# Patient Record
Sex: Male | Born: 1960 | Race: Black or African American | Hispanic: No | Marital: Single | Smoking: Current every day smoker
Health system: Southern US, Community
[De-identification: ages and names within clinical notes are randomized; demographics above are authoritative.]

## PROBLEM LIST (undated history)

## (undated) DIAGNOSIS — I739 Peripheral vascular disease, unspecified: Secondary | ICD-10-CM

## (undated) DIAGNOSIS — J45909 Unspecified asthma, uncomplicated: Secondary | ICD-10-CM

## (undated) DIAGNOSIS — C801 Malignant (primary) neoplasm, unspecified: Secondary | ICD-10-CM

## (undated) HISTORY — DX: Peripheral vascular disease, unspecified: I73.9

---

## 2013-09-18 DIAGNOSIS — C801 Malignant (primary) neoplasm, unspecified: Secondary | ICD-10-CM

## 2013-09-18 HISTORY — DX: Malignant (primary) neoplasm, unspecified: C80.1

## 2013-10-03 ENCOUNTER — Emergency Department (HOSPITAL_COMMUNITY): Payer: Medicaid Other

## 2013-10-03 ENCOUNTER — Encounter (HOSPITAL_COMMUNITY): Payer: Self-pay | Admitting: Emergency Medicine

## 2013-10-03 ENCOUNTER — Inpatient Hospital Stay (HOSPITAL_COMMUNITY)
Admission: EM | Admit: 2013-10-03 | Discharge: 2013-10-08 | DRG: 330 | Disposition: A | Discharge status: Home / Self Care | Payer: Medicaid Other | Attending: Surgery | Admitting: Surgery

## 2013-10-03 DIAGNOSIS — F17200 Nicotine dependence, unspecified, uncomplicated: Secondary | ICD-10-CM | POA: Diagnosis present

## 2013-10-03 DIAGNOSIS — K5669 Other intestinal obstruction: Secondary | ICD-10-CM | POA: Diagnosis present

## 2013-10-03 DIAGNOSIS — I708 Atherosclerosis of other arteries: Secondary | ICD-10-CM | POA: Diagnosis present

## 2013-10-03 DIAGNOSIS — IMO0002 Reserved for concepts with insufficient information to code with codable children: Secondary | ICD-10-CM

## 2013-10-03 DIAGNOSIS — R188 Other ascites: Secondary | ICD-10-CM | POA: Diagnosis present

## 2013-10-03 DIAGNOSIS — D72829 Elevated white blood cell count, unspecified: Secondary | ICD-10-CM | POA: Diagnosis present

## 2013-10-03 DIAGNOSIS — I745 Embolism and thrombosis of iliac artery: Secondary | ICD-10-CM | POA: Diagnosis present

## 2013-10-03 DIAGNOSIS — D62 Acute posthemorrhagic anemia: Secondary | ICD-10-CM | POA: Diagnosis not present

## 2013-10-03 DIAGNOSIS — D490 Neoplasm of unspecified behavior of digestive system: Secondary | ICD-10-CM

## 2013-10-03 DIAGNOSIS — C180 Malignant neoplasm of cecum: Principal | ICD-10-CM | POA: Diagnosis present

## 2013-10-03 DIAGNOSIS — E46 Unspecified protein-calorie malnutrition: Secondary | ICD-10-CM | POA: Diagnosis present

## 2013-10-03 DIAGNOSIS — Z8042 Family history of malignant neoplasm of prostate: Secondary | ICD-10-CM

## 2013-10-03 DIAGNOSIS — D649 Anemia, unspecified: Secondary | ICD-10-CM | POA: Diagnosis present

## 2013-10-03 DIAGNOSIS — K56609 Unspecified intestinal obstruction, unspecified as to partial versus complete obstruction: Secondary | ICD-10-CM

## 2013-10-03 DIAGNOSIS — C772 Secondary and unspecified malignant neoplasm of intra-abdominal lymph nodes: Secondary | ICD-10-CM | POA: Diagnosis present

## 2013-10-03 DIAGNOSIS — C786 Secondary malignant neoplasm of retroperitoneum and peritoneum: Secondary | ICD-10-CM | POA: Diagnosis present

## 2013-10-03 DIAGNOSIS — C189 Malignant neoplasm of colon, unspecified: Secondary | ICD-10-CM | POA: Diagnosis present

## 2013-10-03 DIAGNOSIS — R109 Unspecified abdominal pain: Secondary | ICD-10-CM

## 2013-10-03 DIAGNOSIS — Z801 Family history of malignant neoplasm of trachea, bronchus and lung: Secondary | ICD-10-CM

## 2013-10-03 DIAGNOSIS — I739 Peripheral vascular disease, unspecified: Secondary | ICD-10-CM | POA: Diagnosis present

## 2013-10-03 DIAGNOSIS — R634 Abnormal weight loss: Secondary | ICD-10-CM | POA: Diagnosis present

## 2013-10-03 LAB — COMPREHENSIVE METABOLIC PANEL
ALBUMIN: 3.1 g/dL — AB (ref 3.5–5.2)
ALK PHOS: 71 U/L (ref 39–117)
ALT: 9 U/L (ref 0–53)
AST: 13 U/L (ref 0–37)
BUN: 13 mg/dL (ref 6–23)
CHLORIDE: 94 meq/L — AB (ref 96–112)
CO2: 21 mEq/L (ref 19–32)
Calcium: 9.1 mg/dL (ref 8.4–10.5)
Creatinine, Ser: 0.85 mg/dL (ref 0.50–1.35)
GFR calc Af Amer: >90 mL/min (ref >90)
GFR calc non Af Amer: >90 mL/min (ref >90)
Glucose, Bld: 110 mg/dL — ABNORMAL HIGH (ref 70–99)
POTASSIUM: 3.7 meq/L (ref 3.7–5.3)
SODIUM: 134 meq/L — AB (ref 137–147)
Total Bilirubin: 0.3 mg/dL (ref 0.3–1.2)
Total Protein: 6.8 g/dL (ref 6.0–8.3)

## 2013-10-03 LAB — ABO/RH: ABO/RH(D): O POS

## 2013-10-03 LAB — URINALYSIS, ROUTINE W REFLEX MICROSCOPIC
Glucose, UA: NEGATIVE mg/dL
Hgb urine dipstick: NEGATIVE
KETONES UR: 15 mg/dL — AB
LEUKOCYTES UA: NEGATIVE
Nitrite: NEGATIVE
PH: 6 (ref 5.0–8.0)
Protein, ur: 100 mg/dL — AB
Specific Gravity, Urine: 1.033 — ABNORMAL HIGH (ref 1.005–1.030)
Urobilinogen, UA: 1 mg/dL (ref 0.0–1.0)

## 2013-10-03 LAB — CBC WITH DIFFERENTIAL/PLATELET
Basophils Absolute: 0 10*3/uL (ref 0.0–0.1)
Basophils Relative: 0 % (ref 0–1)
EOS PCT: 0 % (ref 0–5)
Eosinophils Absolute: 0 10*3/uL (ref 0.0–0.7)
HCT: 23.2 % — ABNORMAL LOW (ref 39.0–52.0)
Hemoglobin: 6.7 g/dL — CL (ref 13.0–17.0)
Lymphocytes Relative: 13 % (ref 12–46)
Lymphs Abs: 1.5 10*3/uL (ref 0.7–4.0)
MCH: 16.5 pg — ABNORMAL LOW (ref 26.0–34.0)
MCHC: 28.9 g/dL — ABNORMAL LOW (ref 30.0–36.0)
MCV: 57.1 fL — ABNORMAL LOW (ref 78.0–100.0)
MONOS PCT: 7 % (ref 3–12)
Monocytes Absolute: 0.8 10*3/uL (ref 0.1–1.0)
NEUTROS PCT: 80 % — AB (ref 43–77)
Neutro Abs: 9.4 10*3/uL — ABNORMAL HIGH (ref 1.7–7.7)
Platelets: 636 10*3/uL — ABNORMAL HIGH (ref 150–400)
RBC: 4.06 MIL/uL — AB (ref 4.22–5.81)
RDW: 19.1 % — ABNORMAL HIGH (ref 11.5–15.5)
WBC: 11.7 10*3/uL — AB (ref 4.0–10.5)

## 2013-10-03 LAB — TROPONIN I: Troponin I: <0.3 ng/mL (ref <0.30)

## 2013-10-03 LAB — PREPARE RBC (CROSSMATCH)

## 2013-10-03 LAB — LIPASE, BLOOD: Lipase: 9 U/L — ABNORMAL LOW (ref 11–59)

## 2013-10-03 LAB — URINE MICROSCOPIC-ADD ON

## 2013-10-03 LAB — CEA: CEA: <0.5 ng/mL (ref 0.0–5.0)

## 2013-10-03 LAB — HEMOGLOBIN AND HEMATOCRIT, BLOOD
HEMATOCRIT: 25.2 % — AB (ref 39.0–52.0)
Hemoglobin: 7.6 g/dL — ABNORMAL LOW (ref 13.0–17.0)

## 2013-10-03 LAB — I-STAT CG4 LACTIC ACID, ED: LACTIC ACID, VENOUS: 1.22 mmol/L (ref 0.5–2.2)

## 2013-10-03 LAB — ETHANOL: Alcohol, Ethyl (B): <11 mg/dL (ref 0–11)

## 2013-10-03 LAB — POC OCCULT BLOOD, ED: FECAL OCCULT BLD: NEGATIVE

## 2013-10-03 LAB — PREALBUMIN: Prealbumin: 10.2 mg/dL — ABNORMAL LOW (ref 17.0–34.0)

## 2013-10-03 MED ORDER — MORPHINE SULFATE 2 MG/ML IJ SOLN
2.0000 mg | Freq: Once | INTRAMUSCULAR | Status: AC
Start: 2013-10-03 — End: 2013-10-03
  Administered 2013-10-03: 2 mg via INTRAVENOUS
  Filled 2013-10-03: qty 1

## 2013-10-03 MED ORDER — IOHEXOL 300 MG/ML  SOLN
25.0000 mL | INTRAMUSCULAR | Status: AC
  Administered 2013-10-03: 25 mL via ORAL

## 2013-10-03 MED ORDER — SODIUM CHLORIDE 0.9 % IV SOLN
INTRAVENOUS | Status: DC
  Administered 2013-10-03 – 2013-10-07 (×8): via INTRAVENOUS

## 2013-10-03 MED ORDER — SODIUM CHLORIDE 0.9 % IV BOLUS (SEPSIS)
1000.0000 mL | Freq: Once | INTRAVENOUS | Status: AC
  Administered 2013-10-03: 1000 mL via INTRAVENOUS

## 2013-10-03 MED ORDER — IOHEXOL 300 MG/ML  SOLN
100.0000 mL | Freq: Once | INTRAMUSCULAR | Status: AC | PRN
  Administered 2013-10-03: 100 mL via INTRAVENOUS

## 2013-10-03 MED ORDER — ONDANSETRON HCL 4 MG/2ML IJ SOLN
4.0000 mg | Freq: Four times a day (QID) | INTRAMUSCULAR | Status: DC | PRN
  Administered 2013-10-03: 4 mg via INTRAVENOUS
  Filled 2013-10-03: qty 2

## 2013-10-03 MED ORDER — ONDANSETRON HCL 4 MG/2ML IJ SOLN
4.0000 mg | Freq: Once | INTRAMUSCULAR | Status: AC
  Administered 2013-10-03: 4 mg via INTRAVENOUS
  Filled 2013-10-03: qty 2

## 2013-10-03 MED ORDER — MORPHINE SULFATE 2 MG/ML IJ SOLN
2.0000 mg | INTRAMUSCULAR | Status: DC | PRN
  Administered 2013-10-03: 2 mg via INTRAVENOUS
  Filled 2013-10-03: qty 1

## 2013-10-03 NOTE — H&P (Signed)
I have seen and examined the patient.  GI consult pending - I'm not sure if he will be able to tolerate a prep since his distal small bowel is obstructed.    Will transfuse today; may need right hemicolectomy tomorrow.  CEA level pending.  Imogene Burn. Georgette Dover, MD, [facility]  General/ Trauma Surgery  10/03/2013 3:44 PM

## 2013-10-03 NOTE — ED Notes (Signed)
Patient transported to CT 

## 2013-10-03 NOTE — ED Notes (Signed)
Attempted report 

## 2013-10-03 NOTE — ED Notes (Signed)
Results of lactic acid given to [place], PA-C 

## 2013-10-03 NOTE — Discharge Planning (Signed)
[name], Community Liaison  Spoke to patient about primary care resources and establishing care with a provider. Orange card application and instructions provided. Patient was also given a resource guide and my contact information for any future questions or concerns.

## 2013-10-03 NOTE — H&P (Signed)
Chief Complaint: abdominal pain, weight loss, vomiting  HPI: Rick Davenport is a 53 year old healthy African American male who presents to [facility] with abdominal pain. Duration of symptoms is 4 months. Onset of symptoms was in December at which time he developed vomiting and abdominal pain. He reports being given over the counter pills by his son which helped his symptoms until about 2 weeks ago at which point he ran out. He is unsure of exactly what he was taking, stating "they looked like little stars." Over the past 2 weeks his symptoms have persisted. On Monday, he took a laxative and had some diarrhea, but did not help with the pain. This morning, the pain acutely worsened. Location is generalized abdomen. Time pattern is constant. Moderate in severity. Not associated with nausea or vomiting at present time. He reports frequent hiccups. No modifying factors. Aggravated by movement and lying in supine position. Alleviating factors; fetal position.  He reports he typically has a bowel movement 2 times per week without laxatives. He denies change in caliber stool, melena or hematochezia. He reports approximately 30lb weight loss over the last 6 months. He appetite has been adequate. He denies a family history of colon cancer. He is unsure of his father's medical history. He has 4 children who are healthy. He is a daily smoker, 1 pack per week and drinks 1 can of beer daily when he has the funds. He denies illicit drug use. He has never had a colonoscopy.  Ct of abdomen and pelvis showed distal cecal thickening consistent with a mass with local nodal metastasis in the ileal cecal mesentery in addition to a distal small bowel obstruction. Hemoglobin and hematocrit of 6.7/23.2 with a negative FOBT. We have been asked to evaluate the patient for suspected colorectal cancer.   History reviewed. No pertinent past medical history.  History reviewed. No pertinent past surgical history.  Family History  Problem  Relation Age of Onset  . Diabetes Mother    Social History:  reports that he has been smoking Cigarettes.  He has been smoking about 1.00 pack per day. He does not have any smokeless tobacco history on file. He reports that he drinks about .6 ounces of alcohol per week. He reports that he does not use illicit drugs.  Allergies: No Known Allergies  Medication: none .  (Not in a hospital admission)  Results for orders placed during the hospital encounter of 10/03/13 (from the past 48 hour(s))  CBC WITH DIFFERENTIAL     Status: Abnormal   Collection Time    10/03/13  9:00 AM      Result Value Ref Range   WBC 11.7 (*) 4.0 - 10.5 K/uL   RBC 4.06 (*) 4.22 - 5.81 MIL/uL   Hemoglobin 6.7 (*) 13.0 - 17.0 g/dL   Comment: SPECIMEN CHECKED FOR CLOTS     REPEATED TO VERIFY     CRITICAL RESULT CALLED TO, READ BACK BY AND VERIFIED WITH:     SHULAR,L RN 0935 10/03/13 BROWNM   HCT 23.2 (*) 39.0 - 52.0 %   MCV 57.1 (*) 78.0 - 100.0 fL   MCH 16.5 (*) 26.0 - 34.0 pg   MCHC 28.9 (*) 30.0 - 36.0 g/dL   RDW 19.1 (*) 11.5 - 15.5 %   Platelets 636 (*) 150 - 400 K/uL   Neutrophils Relative % 80 (*) 43 - 77 %   Lymphocytes Relative 13  12 - 46 %   Monocytes Relative 7  3 -  12 %   Eosinophils Relative 0  0 - 5 %   Basophils Relative 0  0 - 1 %   Neutro Abs 9.4 (*) 1.7 - 7.7 K/uL   Lymphs Abs 1.5  0.7 - 4.0 K/uL   Monocytes Absolute 0.8  0.1 - 1.0 K/uL   Eosinophils Absolute 0.0  0.0 - 0.7 K/uL   Basophils Absolute 0.0  0.0 - 0.1 K/uL   RBC Morphology ELLIPTOCYTES     Comment: POLYCHROMASIA PRESENT     TARGET CELLS     TEARDROP CELLS   Smear Review LARGE PLATELETS PRESENT    COMPREHENSIVE METABOLIC PANEL     Status: Abnormal   Collection Time    10/03/13  9:00 AM      Result Value Ref Range   Sodium 134 (*) 137 - 147 mEq/L   Potassium 3.7  3.7 - 5.3 mEq/L   Chloride 94 (*) 96 - 112 mEq/L   CO2 21  19 - 32 mEq/L   Glucose, Bld 110 (*) 70 - 99 mg/dL   BUN 13  6 - 23 mg/dL   Creatinine, Ser  0.85  0.50 - 1.35 mg/dL   Calcium 9.1  8.4 - 10.5 mg/dL   Total Protein 6.8  6.0 - 8.3 g/dL   Albumin 3.1 (*) 3.5 - 5.2 g/dL   AST 13  0 - 37 U/L   ALT 9  0 - 53 U/L   Alkaline Phosphatase 71  39 - 117 U/L   Total Bilirubin 0.3  0.3 - 1.2 mg/dL   GFR calc non Af Amer >90  >90 mL/min   GFR calc Af Amer >90  >90 mL/min   Comment: (NOTE)     The eGFR has been calculated using the CKD EPI equation.     This calculation has not been validated in all clinical situations.     eGFR's persistently <90 mL/min signify possible Chronic Kidney     Disease.  LIPASE, BLOOD     Status: Abnormal   Collection Time    10/03/13  9:00 AM      Result Value Ref Range   Lipase 9 (*) 11 - 59 U/L  POC OCCULT BLOOD, ED     Status: None   Collection Time    10/03/13  9:55 AM      Result Value Ref Range   Fecal Occult Bld NEGATIVE  NEGATIVE  TROPONIN I     Status: None   Collection Time    10/03/13 10:11 AM      Result Value Ref Range   Troponin I <0.30  <0.30 ng/mL   Comment:            Due to the release kinetics of cTnI,     a negative result within the first hours     of the onset of symptoms does not rule out     myocardial infarction with certainty.     If myocardial infarction is still suspected,     repeat the test at appropriate intervals.  ETHANOL     Status: None   Collection Time    10/03/13 10:11 AM      Result Value Ref Range   Alcohol, Ethyl (B) <11  0 - 11 mg/dL   Comment:            LOWEST DETECTABLE LIMIT FOR     SERUM ALCOHOL IS 11 mg/dL     FOR MEDICAL PURPOSES ONLY  TYPE AND SCREEN  Status: None   Collection Time    10/03/13 10:11 AM      Result Value Ref Range   ABO/RH(D) O POS     Antibody Screen NEG     Sample Expiration 10/06/2013     Unit Number [id number]     Blood Component Type RED CELLS,LR     Unit division 00     Status of Unit ISSUED     Transfusion Status OK TO TRANSFUSE     Crossmatch Result Compatible     Unit Number [id number]     Blood  Component Type RBC LR PHER2     Unit division 00     Status of Unit ALLOCATED     Transfusion Status OK TO TRANSFUSE     Crossmatch Result Compatible    ABO/RH     Status: None   Collection Time    10/03/13 10:11 AM      Result Value Ref Range   ABO/RH(D) O POS    URINALYSIS, ROUTINE W REFLEX MICROSCOPIC     Status: Abnormal   Collection Time    10/03/13 10:15 AM      Result Value Ref Range   Color, Urine AMBER (*) YELLOW   Comment: BIOCHEMICALS MAY BE AFFECTED BY COLOR   APPearance HAZY (*) CLEAR   Specific Gravity, Urine 1.033 (*) 1.005 - 1.030   pH 6.0  5.0 - 8.0   Glucose, UA NEGATIVE  NEGATIVE mg/dL   Hgb urine dipstick NEGATIVE  NEGATIVE   Bilirubin Urine SMALL (*) NEGATIVE   Ketones, ur 15 (*) NEGATIVE mg/dL   Protein, ur 100 (*) NEGATIVE mg/dL   Urobilinogen, UA 1.0  0.0 - 1.0 mg/dL   Nitrite NEGATIVE  NEGATIVE   Leukocytes, UA NEGATIVE  NEGATIVE  URINE MICROSCOPIC-ADD ON     Status: Abnormal   Collection Time    10/03/13 10:15 AM      Result Value Ref Range   WBC, UA 0-2  <3 WBC/hpf   RBC / HPF 0-2  <3 RBC/hpf   Bacteria, UA FEW (*) RARE   Casts GRANULAR CAST (*) NEGATIVE   Sperm, UA PRESENT     Urine-Other MUCOUS PRESENT    I-STAT CG4 LACTIC ACID, ED     Status: None   Collection Time    10/03/13 10:21 AM      Result Value Ref Range   Lactic Acid, Venous 1.22  0.5 - 2.2 mmol/L  PREPARE RBC (CROSSMATCH)     Status: None   Collection Time    10/03/13 12:17 PM      Result Value Ref Range   Order Confirmation ORDER PROCESSED BY BLOOD BANK     Ct Abdomen Pelvis W Contrast  10/03/2013   CLINICAL DATA:  Abdominal pain, vomiting  EXAM: CT ABDOMEN AND PELVIS WITH CONTRAST  TECHNIQUE: Multidetector CT imaging of the abdomen and pelvis was performed using the standard protocol following bolus administration of intravenous contrast.  CONTRAST:  134m OMNIPAQUE IOHEXOL 300 MG/ML  SOLN  COMPARISON:  None.  FINDINGS: Lung bases are clear.  No pericardial fluid.  No focal  hepatic lesion gallbladder, pancreas, spleen, adrenal glands, and kidneys are normal.  Stomach appears normal. There is some contraction through the gastric body which is likely physiologic. There are dilated loops of the small bowel distally. Poor progression of the oral contrast. The distal small bowel is dilated up to 3.3 cm. There is irregular thickening of the cecum involving the cecal tip, terminal  ileum, and circumferentially involving the distal cecum over approximately 7.5 cm segment. The appendix is identified and is normal caliber. There several small lymph nodes along the pericolic gutter inferior to the cecal tip (image 57, series 2 for example lymph node measures 7 mm). There is a large a rounded lymph node in the ileocecal mesentery measuring 10 mm (image 46). The more distal ascending ascending, transverse, and descending colon are normal. There is minimal stool in the distal colon.  Abdominal or is normal caliber. No retroperitoneal lymphadenopathy. No periportal lymphadenopathy.  No free fluid in the pelvis. Edge of free fluid in the pelvis and small mildly the mesentery.  The prostate gland is normal.  No pelvic lymphadenopathy.  IMPRESSION: 1. Irregular masslike thickening of the distal cecum is most consistent colorectal carcinoma. 2. Distal small bowel obstruction with obstruction at level of the ileocecal valve. 3. 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 4. Smaller free fluid within the abdomen pelvis related to the bowel obstruction. 5. Contraction through the gastric body is likely physiologic nondistention. Findings conveyed toMARISSA SCIACCA on 10/03/2013  at12:27.   Electronically Signed   By: Suzy Bouchard M.D.   On: 10/03/2013 12:28   US Abdomen Limited Ruq  10/03/2013   CLINICAL DATA:  Right upper quadrant pain.  EXAM: US ABDOMEN LIMITED - RIGHT UPPER QUADRANT  COMPARISON:  None.  FINDINGS: Gallbladder:  No gallstones or wall thickening visualized. No sonographic  Murphy sign noted.  Common bile duct:  Diameter: Normal measuring 1.3 mm  Liver:  Heterogeneous echotexture without focal mass. No overall enlargement.  Trace ascites.  IMPRESSION: Trace ascites. Heterogeneous liver echotexture without focal mass. No gallstones or signs of acute cholecystitis.   Electronically Signed   By: Rolla Flatten M.D.   On: 10/03/2013 11:34    ROS  Blood pressure 144/97, pulse 108, temperature 98.7 F (37.1 C), temperature source Oral, resp. rate 16, height 6' (1.829 m), weight 160 lb (72.576 kg), SpO2 100.00%. Physical Exam  Constitutional: He is oriented to person, place, and time. No distress.  Appears malnourished   HENT:  Head: Normocephalic and atraumatic.  Eyes: Conjunctivae and EOM are normal. Pupils are equal, round, and reactive to light. Right eye exhibits no discharge. Left eye exhibits no discharge. No scleral icterus.  Neck: Normal range of motion. Neck supple.  Cardiovascular: Normal rate, regular rhythm, normal heart sounds and intact distal pulses.  Exam reveals no gallop and no friction rub.   No murmur heard. Respiratory: Effort normal and breath sounds normal. No respiratory distress. He has no wheezes. He has no rales. He exhibits no tenderness.  GI: Soft. Bowel sounds are normal. He exhibits no mass. There is no rebound.  Lower abdominal tenderness with voluntary guarding    Musculoskeletal: Normal range of motion. He exhibits no edema and no tenderness.  Lymphadenopathy:    He has no cervical adenopathy.  Neurological: He is alert and oriented to person, place, and time.  Skin: Skin is warm and dry. He is not diaphoretic.  Psychiatric: He has a normal mood and affect. His behavior is normal. Judgment and thought content normal.     Assessment:  Distal small bowel obstruction, secondary to presumed distal cecal mass concerning for colon cancer  Local nodal metastasis  Weight loss  Tobacco abuse  Anemia  Plan:  -GI consultation for a  colonoscopy  -NPO for now  -gastric tube should the patient develop nausea/vomiting  -Obtain a CEA and prealbumin  -pain  control and antiemetics -await colonoscopy and pathology results. He will likely need surgical intervention this admission given obstructive symptoms.  -transfuse 2u of PRBCs, post transfusion h&h  Noemie Devivo ANP-BC Pager [phone] 10/03/2013, 2:39 PM

## 2013-10-03 NOTE — Consult Note (Signed)
Reason for Consult: Cecal mass Referring Physician: [facility]  Geordon Vandermeer HPI: This is a 53 year old male who presents to the ER with complaints of abdominal pain.  His pain started in December and it progressively worsened.  He was taking some type of medication from his son which helped his pain. Upon presentation to the ER was was identified to have a cecal mass, local mets, and distal SBO.  There is also trace ascites on the ultrasound.  No family history of colon cancer.  History reviewed. No pertinent past medical history.  History reviewed. No pertinent past surgical history.  Family History  Problem Relation Age of Onset  . Diabetes Mother     Social History:  reports that he has been smoking Cigarettes.  He has been smoking about 1.00 pack per day. He does not have any smokeless tobacco history on file. He reports that he drinks about .6 ounces of alcohol per week. He reports that he does not use illicit drugs.  Allergies: No Known Allergies  Medications:  Scheduled:  Continuous: . sodium chloride 125 mL/hr at 10/03/13 1656    Results for orders placed during the hospital encounter of 10/03/13 (from the past 24 hour(s))  CBC WITH DIFFERENTIAL     Status: Abnormal   Collection Time    10/03/13  9:00 AM      Result Value Ref Range   WBC 11.7 (*) 4.0 - 10.5 K/uL   RBC 4.06 (*) 4.22 - 5.81 MIL/uL   Hemoglobin 6.7 (*) 13.0 - 17.0 g/dL   HCT 23.2 (*) 39.0 - 52.0 %   MCV 57.1 (*) 78.0 - 100.0 fL   MCH 16.5 (*) 26.0 - 34.0 pg   MCHC 28.9 (*) 30.0 - 36.0 g/dL   RDW 19.1 (*) 11.5 - 15.5 %   Platelets 636 (*) 150 - 400 K/uL   Neutrophils Relative % 80 (*) 43 - 77 %   Lymphocytes Relative 13  12 - 46 %   Monocytes Relative 7  3 - 12 %   Eosinophils Relative 0  0 - 5 %   Basophils Relative 0  0 - 1 %   Neutro Abs 9.4 (*) 1.7 - 7.7 K/uL   Lymphs Abs 1.5  0.7 - 4.0 K/uL   Monocytes Absolute 0.8  0.1 - 1.0 K/uL   Eosinophils Absolute 0.0  0.0 - 0.7 K/uL   Basophils  Absolute 0.0  0.0 - 0.1 K/uL   RBC Morphology ELLIPTOCYTES     Smear Review LARGE PLATELETS PRESENT    COMPREHENSIVE METABOLIC PANEL     Status: Abnormal   Collection Time    10/03/13  9:00 AM      Result Value Ref Range   Sodium 134 (*) 137 - 147 mEq/L   Potassium 3.7  3.7 - 5.3 mEq/L   Chloride 94 (*) 96 - 112 mEq/L   CO2 21  19 - 32 mEq/L   Glucose, Bld 110 (*) 70 - 99 mg/dL   BUN 13  6 - 23 mg/dL   Creatinine, Ser 0.85  0.50 - 1.35 mg/dL   Calcium 9.1  8.4 - 10.5 mg/dL   Total Protein 6.8  6.0 - 8.3 g/dL   Albumin 3.1 (*) 3.5 - 5.2 g/dL   AST 13  0 - 37 U/L   ALT 9  0 - 53 U/L   Alkaline Phosphatase 71  39 - 117 U/L   Total Bilirubin 0.3  0.3 - 1.2 mg/dL  GFR calc non Af Amer >90  >90 mL/min   GFR calc Af Amer >90  >90 mL/min  LIPASE, BLOOD     Status: Abnormal   Collection Time    10/03/13  9:00 AM      Result Value Ref Range   Lipase 9 (*) 11 - 59 U/L  POC OCCULT BLOOD, ED     Status: None   Collection Time    10/03/13  9:55 AM      Result Value Ref Range   Fecal Occult Bld NEGATIVE  NEGATIVE  TROPONIN I     Status: None   Collection Time    10/03/13 10:11 AM      Result Value Ref Range   Troponin I <0.30  <0.30 ng/mL  ETHANOL     Status: None   Collection Time    10/03/13 10:11 AM      Result Value Ref Range   Alcohol, Ethyl (B) <11  0 - 11 mg/dL  TYPE AND SCREEN     Status: None   Collection Time    10/03/13 10:11 AM      Result Value Ref Range   ABO/RH(D) O POS     Antibody Screen NEG     Sample Expiration 10/06/2013     Unit Number [id number]     Blood Component Type RED CELLS,LR     Unit division 00     Status of Unit ISSUED     Transfusion Status OK TO TRANSFUSE     Crossmatch Result Compatible     Unit Number [id number]     Blood Component Type RBC LR PHER2     Unit division 00     Status of Unit ALLOCATED     Transfusion Status OK TO TRANSFUSE     Crossmatch Result Compatible    ABO/RH     Status: None   Collection Time    10/03/13  10:11 AM      Result Value Ref Range   ABO/RH(D) O POS    URINALYSIS, ROUTINE W REFLEX MICROSCOPIC     Status: Abnormal   Collection Time    10/03/13 10:15 AM      Result Value Ref Range   Color, Urine AMBER (*) YELLOW   APPearance HAZY (*) CLEAR   Specific Gravity, Urine 1.033 (*) 1.005 - 1.030   pH 6.0  5.0 - 8.0   Glucose, UA NEGATIVE  NEGATIVE mg/dL   Hgb urine dipstick NEGATIVE  NEGATIVE   Bilirubin Urine SMALL (*) NEGATIVE   Ketones, ur 15 (*) NEGATIVE mg/dL   Protein, ur 100 (*) NEGATIVE mg/dL   Urobilinogen, UA 1.0  0.0 - 1.0 mg/dL   Nitrite NEGATIVE  NEGATIVE   Leukocytes, UA NEGATIVE  NEGATIVE  URINE MICROSCOPIC-ADD ON     Status: Abnormal   Collection Time    10/03/13 10:15 AM      Result Value Ref Range   WBC, UA 0-2  <3 WBC/hpf   RBC / HPF 0-2  <3 RBC/hpf   Bacteria, UA FEW (*) RARE   Casts GRANULAR CAST (*) NEGATIVE   Sperm, UA PRESENT     Urine-Other MUCOUS PRESENT    I-STAT CG4 LACTIC ACID, ED     Status: None   Collection Time    10/03/13 10:21 AM      Result Value Ref Range   Lactic Acid, Venous 1.22  0.5 - 2.2 mmol/L  PREPARE RBC (CROSSMATCH)     Status: None  Collection Time    10/03/13 12:17 PM      Result Value Ref Range   Order Confirmation ORDER PROCESSED BY BLOOD BANK       Ct Abdomen Pelvis W Contrast  10/03/2013   CLINICAL DATA:  Abdominal pain, vomiting  EXAM: CT ABDOMEN AND PELVIS WITH CONTRAST  TECHNIQUE: Multidetector CT imaging of the abdomen and pelvis was performed using the standard protocol following bolus administration of intravenous contrast.  CONTRAST:  150mL OMNIPAQUE IOHEXOL 300 MG/ML  SOLN  COMPARISON:  None.  FINDINGS: Lung bases are clear.  No pericardial fluid.  No focal hepatic lesion gallbladder, pancreas, spleen, adrenal glands, and kidneys are normal.  Stomach appears normal. There is some contraction through the gastric body which is likely physiologic. There are dilated loops of the small bowel distally. Poor progression  of the oral contrast. The distal small bowel is dilated up to 3.3 cm. There is irregular thickening of the cecum involving the cecal tip, terminal ileum, and circumferentially involving the distal cecum over approximately 7.5 cm segment. The appendix is identified and is normal caliber. There several small lymph nodes along the pericolic gutter inferior to the cecal tip (image 57, series 2 for example lymph node measures 7 mm). There is a large a rounded lymph node in the ileocecal mesentery measuring 10 mm (image 46). The more distal ascending ascending, transverse, and descending colon are normal. There is minimal stool in the distal colon.  Abdominal or is normal caliber. No retroperitoneal lymphadenopathy. No periportal lymphadenopathy.  No free fluid in the pelvis. Edge of free fluid in the pelvis and small mildly the mesentery.  The prostate gland is normal.  No pelvic lymphadenopathy.  IMPRESSION: 1. Irregular masslike thickening of the distal cecum is most consistent colorectal carcinoma. 2. Distal small bowel obstruction with obstruction at level of the ileocecal valve. 3. 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 4. Smaller free fluid within the abdomen pelvis related to the bowel obstruction. 5. Contraction through the gastric body is likely physiologic nondistention. Findings conveyed toMARISSA SCIACCA on 10/03/2013  at12:27.   Electronically Signed   By: Suzy Bouchard M.D.   On: 10/03/2013 12:28   US Abdomen Limited Ruq  10/03/2013   CLINICAL DATA:  Right upper quadrant pain.  EXAM: US ABDOMEN LIMITED - RIGHT UPPER QUADRANT  COMPARISON:  None.  FINDINGS: Gallbladder:  No gallstones or wall thickening visualized. No sonographic Murphy sign noted.  Common bile duct:  Diameter: Normal measuring 1.3 mm  Liver:  Heterogeneous echotexture without focal mass. No overall enlargement.  Trace ascites.  IMPRESSION: Trace ascites. Heterogeneous liver echotexture without focal mass. No gallstones or  signs of acute cholecystitis.   Electronically Signed   By: Rolla Flatten M.D.   On: 10/03/2013 11:34    ROS:  As stated above in the HPI otherwise negative.  Blood pressure 144/97, pulse 108, temperature 98.7 F (37.1 C), temperature source Oral, resp. rate 16, height 6' (1.829 m), weight 160 lb (72.576 kg), SpO2 100.00%.    PE: Gen: Alert and Oriented, uncomfortable HEENT:  [place]/AT, EOMI Neck: Supple, no LAD Lungs: CTA Bilaterally CV: RRR without M/G/R ABM: Soft, tender in the RLQ with radiation to the mid abdomen, +BS Ext: No C/C/E  Assessment/Plan: 1) High grade ileocecal obstruction with small bowel dilation. 2) Probable colon cancer.   I confirmed with Radiology that the patient has a high grade obstruction.  Oral contrast does not even make it to the distal small  bowel.  Clinically he does not appear to be able to tolerate a oral prep.  If he attempted an oral prep it will only worsen his distention and abdominal pain.  A colonoscopy is not feasible with this patient.  Plan: 1) Surgical resection.  Beryle Beams 10/03/2013, 2:53 PM

## 2013-10-03 NOTE — ED Notes (Signed)
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 Pt denies nausea, vomiting, diarrhea, or blood in his stool.

## 2013-10-03 NOTE — ED Provider Notes (Signed)
Medical screening examination/treatment/procedure(s) were conducted as a shared visit with non-physician practitioner(s) and myself.  I personally evaluated the patient during the encounter.  Abdominal pain for severals weeks, nausea, vomiting.  Pale on exam. Diffuse lower abdominal tenderness with guarding. New anemia, cecal mass with SBO.  Transfuse, d/w surgery.  EKG Interpretation   Date/Time:  Thursday October 03 2013 09:49:32 EDT Ventricular Rate:  89 PR Interval:  131 QRS Duration: 86 QT Interval:  361 QTC Calculation: 439 R Axis:   59 Text Interpretation:  Sinus rhythm Abnormal R-wave progression, early  transition No significant change was found Confirmed by Wyvonnia Dusky  MD,  Hillari Zumwalt [phone]) on 10/03/2013 10:14:33 AM       Ezequiel Essex, MD 10/03/13 2007

## 2013-10-03 NOTE — ED Notes (Signed)
Tseui MD at bedside.

## 2013-10-03 NOTE — Consult Note (Deleted)
Reason for Consult: distal cecal mass, SBO Referring Physician: Jamse Mead, PA-C   HPI: Rick Davenport is a 53 year old healthy African American male who presents to [facility] with abdominal pain.  Duration of symptoms is 4 months.  Onset of symptoms was in December at which time he developed vomiting and abdominal pain.  He reports being given over the counter pills by his son which helped his symptoms until about 2 weeks ago at which point he ran out.  He is unsure of exactly what he was taking, stating "they looked like little stars."  Over the past 2 weeks his symptoms have persisted.  On Monday, he took a laxative and had some diarrhea, but did not help with the pain.  This morning, the pain acutely worsened.  Location is generalized abdomen.  Time pattern is constant.  Moderate in severity.  Not associated with nausea or vomiting at present time.  He reports frequent hiccups.  No modifying factors.  Aggravated by movement and lying in supine position.  Alleviating factors; fetal position.   He reports he typically has a bowel movement 2 times per week without laxatives.  He denies change in caliber stool, melena or hematochezia.  He reports approximately 30lb weight loss over the last 6 months.  He appetite has been adequate.  He denies a family history of colon cancer.  He is unsure of his father's medical history.  He has 4 children who are healthy.  He is a daily smoker, 1 pack per week and drinks 1 can of beer daily when he has the funds.  He denies illicit drug use.  He has never had a colonoscopy.   Ct of abdomen and pelvis showed distal cecal thickening consistent with a mass with local nodal metastasis in the ileal cecal mesentery in addition to a distal small bowel obstruction. Hemoglobin and hematocrit of 6.7/23.2 with a negative FOBT.  We have been asked to evaluate the patient for suspected colorectal cancer.  History reviewed. No pertinent past medical history.  History reviewed. No  pertinent past surgical history.  Family History  Problem Relation Age of Onset  . Diabetes Mother     Social History:  reports that he has been smoking Cigarettes.  He has been smoking about 1.00 pack per day. He does not have any smokeless tobacco history on file. He reports that he drinks about .6 ounces of alcohol per week. He reports that he does not use illicit drugs.  Allergies: No Known Allergies  Medications: none.    Results for orders placed during the hospital encounter of 10/03/13 (from the past 48 hour(s))  CBC WITH DIFFERENTIAL     Status: Abnormal   Collection Time    10/03/13  9:00 AM      Result Value Ref Range   WBC 11.7 (*) 4.0 - 10.5 K/uL   RBC 4.06 (*) 4.22 - 5.81 MIL/uL   Hemoglobin 6.7 (*) 13.0 - 17.0 g/dL   Comment: SPECIMEN CHECKED FOR CLOTS     REPEATED TO VERIFY     CRITICAL RESULT CALLED TO, READ BACK BY AND VERIFIED WITH:     SHULAR,L RN 0935 10/03/13 BROWNM   HCT 23.2 (*) 39.0 - 52.0 %   MCV 57.1 (*) 78.0 - 100.0 fL   MCH 16.5 (*) 26.0 - 34.0 pg   MCHC 28.9 (*) 30.0 - 36.0 g/dL   RDW 19.1 (*) 11.5 - 15.5 %   Platelets 636 (*) 150 - 400 K/uL  Neutrophils Relative % 80 (*) 43 - 77 %   Lymphocytes Relative 13  12 - 46 %   Monocytes Relative 7  3 - 12 %   Eosinophils Relative 0  0 - 5 %   Basophils Relative 0  0 - 1 %   Neutro Abs 9.4 (*) 1.7 - 7.7 K/uL   Lymphs Abs 1.5  0.7 - 4.0 K/uL   Monocytes Absolute 0.8  0.1 - 1.0 K/uL   Eosinophils Absolute 0.0  0.0 - 0.7 K/uL   Basophils Absolute 0.0  0.0 - 0.1 K/uL   RBC Morphology ELLIPTOCYTES     Comment: POLYCHROMASIA PRESENT     TARGET CELLS     TEARDROP CELLS   Smear Review LARGE PLATELETS PRESENT    COMPREHENSIVE METABOLIC PANEL     Status: Abnormal   Collection Time    10/03/13  9:00 AM      Result Value Ref Range   Sodium 134 (*) 137 - 147 mEq/L   Potassium 3.7  3.7 - 5.3 mEq/L   Chloride 94 (*) 96 - 112 mEq/L   CO2 21  19 - 32 mEq/L   Glucose, Bld 110 (*) 70 - 99 mg/dL   BUN 13  6  - 23 mg/dL   Creatinine, Ser 0.85  0.50 - 1.35 mg/dL   Calcium 9.1  8.4 - 10.5 mg/dL   Total Protein 6.8  6.0 - 8.3 g/dL   Albumin 3.1 (*) 3.5 - 5.2 g/dL   AST 13  0 - 37 U/L   ALT 9  0 - 53 U/L   Alkaline Phosphatase 71  39 - 117 U/L   Total Bilirubin 0.3  0.3 - 1.2 mg/dL   GFR calc non Af Amer >90  >90 mL/min   GFR calc Af Amer >90  >90 mL/min   Comment: (NOTE)     The eGFR has been calculated using the CKD EPI equation.     This calculation has not been validated in all clinical situations.     eGFR's persistently <90 mL/min signify possible Chronic Kidney     Disease.  LIPASE, BLOOD     Status: Abnormal   Collection Time    10/03/13  9:00 AM      Result Value Ref Range   Lipase 9 (*) 11 - 59 U/L  POC OCCULT BLOOD, ED     Status: None   Collection Time    10/03/13  9:55 AM      Result Value Ref Range   Fecal Occult Bld NEGATIVE  NEGATIVE  TROPONIN I     Status: None   Collection Time    10/03/13 10:11 AM      Result Value Ref Range   Troponin I <0.30  <0.30 ng/mL   Comment:            Due to the release kinetics of cTnI,     a negative result within the first hours     of the onset of symptoms does not rule out     myocardial infarction with certainty.     If myocardial infarction is still suspected,     repeat the test at appropriate intervals.  ETHANOL     Status: None   Collection Time    10/03/13 10:11 AM      Result Value Ref Range   Alcohol, Ethyl (B) <11  0 - 11 mg/dL   Comment:  LOWEST DETECTABLE LIMIT FOR     SERUM ALCOHOL IS 11 mg/dL     FOR MEDICAL PURPOSES ONLY  TYPE AND SCREEN     Status: None   Collection Time    10/03/13 10:11 AM      Result Value Ref Range   ABO/RH(D) O POS     Antibody Screen NEG     Sample Expiration 10/06/2013     Unit Number [id number]     Blood Component Type RED CELLS,LR     Unit division 00     Status of Unit ISSUED     Transfusion Status OK TO TRANSFUSE     Crossmatch Result Compatible     Unit Number  [id number]     Blood Component Type RBC LR PHER2     Unit division 00     Status of Unit ALLOCATED     Transfusion Status OK TO TRANSFUSE     Crossmatch Result Compatible    ABO/RH     Status: None   Collection Time    10/03/13 10:11 AM      Result Value Ref Range   ABO/RH(D) O POS    URINALYSIS, ROUTINE W REFLEX MICROSCOPIC     Status: Abnormal   Collection Time    10/03/13 10:15 AM      Result Value Ref Range   Color, Urine AMBER (*) YELLOW   Comment: BIOCHEMICALS MAY BE AFFECTED BY COLOR   APPearance HAZY (*) CLEAR   Specific Gravity, Urine 1.033 (*) 1.005 - 1.030   pH 6.0  5.0 - 8.0   Glucose, UA NEGATIVE  NEGATIVE mg/dL   Hgb urine dipstick NEGATIVE  NEGATIVE   Bilirubin Urine SMALL (*) NEGATIVE   Ketones, ur 15 (*) NEGATIVE mg/dL   Protein, ur 100 (*) NEGATIVE mg/dL   Urobilinogen, UA 1.0  0.0 - 1.0 mg/dL   Nitrite NEGATIVE  NEGATIVE   Leukocytes, UA NEGATIVE  NEGATIVE  URINE MICROSCOPIC-ADD ON     Status: Abnormal   Collection Time    10/03/13 10:15 AM      Result Value Ref Range   WBC, UA 0-2  <3 WBC/hpf   RBC / HPF 0-2  <3 RBC/hpf   Bacteria, UA FEW (*) RARE   Casts GRANULAR CAST (*) NEGATIVE   Sperm, UA PRESENT     Urine-Other MUCOUS PRESENT    I-STAT CG4 LACTIC ACID, ED     Status: None   Collection Time    10/03/13 10:21 AM      Result Value Ref Range   Lactic Acid, Venous 1.22  0.5 - 2.2 mmol/L  PREPARE RBC (CROSSMATCH)     Status: None   Collection Time    10/03/13 12:17 PM      Result Value Ref Range   Order Confirmation ORDER PROCESSED BY BLOOD BANK      Ct Abdomen Pelvis W Contrast  10/03/2013   CLINICAL DATA:  Abdominal pain, vomiting  EXAM: CT ABDOMEN AND PELVIS WITH CONTRAST  TECHNIQUE: Multidetector CT imaging of the abdomen and pelvis was performed using the standard protocol following bolus administration of intravenous contrast.  CONTRAST:  125m OMNIPAQUE IOHEXOL 300 MG/ML  SOLN  COMPARISON:  None.  FINDINGS: Lung bases are clear.  No  pericardial fluid.  No focal hepatic lesion gallbladder, pancreas, spleen, adrenal glands, and kidneys are normal.  Stomach appears normal. There is some contraction through the gastric body which is likely physiologic. There are dilated loops of the small  bowel distally. Poor progression of the oral contrast. The distal small bowel is dilated up to 3.3 cm. There is irregular thickening of the cecum involving the cecal tip, terminal ileum, and circumferentially involving the distal cecum over approximately 7.5 cm segment. The appendix is identified and is normal caliber. There several small lymph nodes along the pericolic gutter inferior to the cecal tip (image 57, series 2 for example lymph node measures 7 mm). There is a large a rounded lymph node in the ileocecal mesentery measuring 10 mm (image 46). The more distal ascending ascending, transverse, and descending colon are normal. There is minimal stool in the distal colon.  Abdominal or is normal caliber. No retroperitoneal lymphadenopathy. No periportal lymphadenopathy.  No free fluid in the pelvis. Edge of free fluid in the pelvis and small mildly the mesentery.  The prostate gland is normal.  No pelvic lymphadenopathy.  IMPRESSION: 1. Irregular masslike thickening of the distal cecum is most consistent colorectal carcinoma. 2. Distal small bowel obstruction with obstruction at level of the ileocecal valve. 3. 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 4. Smaller free fluid within the abdomen pelvis related to the bowel obstruction. 5. Contraction through the gastric body is likely physiologic nondistention. Findings conveyed toMARISSA SCIACCA on 10/03/2013  at12:27.   Electronically Signed   By: Suzy Bouchard M.D.   On: 10/03/2013 12:28   US Abdomen Limited Ruq  10/03/2013   CLINICAL DATA:  Right upper quadrant pain.  EXAM: US ABDOMEN LIMITED - RIGHT UPPER QUADRANT  COMPARISON:  None.  FINDINGS: Gallbladder:  No gallstones or wall thickening  visualized. No sonographic Murphy sign noted.  Common bile duct:  Diameter: Normal measuring 1.3 mm  Liver:  Heterogeneous echotexture without focal mass. No overall enlargement.  Trace ascites.  IMPRESSION: Trace ascites. Heterogeneous liver echotexture without focal mass. No gallstones or signs of acute cholecystitis.   Electronically Signed   By: Rolla Flatten M.D.   On: 10/03/2013 11:34    Review of Systems  Constitutional: Positive for weight loss. Negative for fever, chills, malaise/fatigue and diaphoresis.  Eyes: Negative for blurred vision and double vision.  Respiratory: Negative for shortness of breath and wheezing.   Cardiovascular: Negative for chest pain, palpitations and leg swelling.  Gastrointestinal: Positive for nausea, vomiting, abdominal pain and constipation. Negative for blood in stool and melena.  Genitourinary: Negative for dysuria, urgency, frequency and hematuria.  Musculoskeletal: Negative for joint pain and myalgias.  Neurological: Negative for dizziness, tingling, tremors, seizures, loss of consciousness, weakness and headaches.  Psychiatric/Behavioral: Negative for depression.   Blood pressure 135/88, pulse 87, temperature 98.7 F (37.1 C), temperature source Oral, resp. rate 15, height 6' (1.829 m), weight 160 lb (72.576 kg), SpO2 100.00%. Physical Exam  Constitutional: He is oriented to person, place, and time. No distress.  Appears malnourished   Eyes: Conjunctivae are normal. Pupils are equal, round, and reactive to light. Right eye exhibits no discharge. Left eye exhibits no discharge. No scleral icterus.  Neck: Normal range of motion. Neck supple.  Cardiovascular: Normal rate, regular rhythm, normal heart sounds and intact distal pulses.  Exam reveals no gallop and no friction rub.   No murmur heard. Respiratory: Effort normal and breath sounds normal. No respiratory distress. He has no wheezes. He has no rales. He exhibits no tenderness.  GI: Soft. Bowel  sounds are normal. He exhibits no distension and no mass.  Lower abdominal tenderness with voluntary guarding  Musculoskeletal: Normal range of motion. He exhibits  no edema and no tenderness.  Lymphadenopathy:    He has no cervical adenopathy.  Neurological: He is alert and oriented to person, place, and time.  Skin: Skin is warm and dry. He is not diaphoretic.  Psychiatric: He has a normal mood and affect. His behavior is normal. Judgment and thought content normal.    Assessment: Distal small bowel obstruction, secondary to presumed distal cecal mass concerning for colon cancer Local nodal metastasis  Weight loss Tobacco abuse  Plan: -GI consultation for a colonoscopy -NPO for now -gastric tube should the patient develop nausea/vomiting -Obtain a CEA and prealbumin -await colonoscopy and pathology results.  He will likely need surgical intervention this admission given obstructive symptoms. -surgery will follow along.    Thank you for the consult   Erby Pian ANP-BC Pager [phone] 10/03/2013, 2:00 PM

## 2013-10-03 NOTE — ED Provider Notes (Signed)
CSN: [id number]     Arrival date & time 10/03/13  1751 History   First MD Initiated Contact with Patient 10/03/13 [phone]     Chief Complaint  Patient presents with  . Abdominal Pain     (Consider location/radiation/quality/duration/timing/severity/associated sxs/prior Treatment) The history is provided by the patient. No language interpreter was used.  Rick Davenport is a 53 y/o M with no known significant PMHx presenting to the ED with abdominal pain that has been ongoing for the past 2 weeks. Patient reported that the abdominal pain is localized to the upper abdomen described as a "knot" that someone is pulling tighter and tighter without radiation. Stated that the pain is constant. Reported that he has used nothing for the pain. Reported that when he eats the pain feels worse. Reported that he has has nausea with vomiting on Tuesday - mainly of food contents after eating [place]. Patient reported that he drinks alcohol at least one can of beer per day. Reported that he smokes 1 ppd of cigarettes. Denied fever, chills, chest pain, shortness of breath, difficulty breathing, diarrhea, melena, hematochezia, dysuria, hematuria, headache, dizziness, difficulty swallowing, sick contacts. PCP none  History reviewed. No pertinent past medical history. History reviewed. No pertinent past surgical history. History reviewed. No pertinent family history. History  Substance Use Topics  . Smoking status: Current Every Day Smoker  . Smokeless tobacco: Not on file  . Alcohol Use: Yes    Review of Systems  Constitutional: Negative for fever and chills.  Respiratory: Negative for chest tightness and shortness of breath.   Cardiovascular: Negative for chest pain.  Gastrointestinal: Positive for nausea, vomiting and abdominal pain. Negative for diarrhea, constipation, blood in stool and anal bleeding.  Genitourinary: Negative for dysuria, hematuria and decreased urine volume.    Musculoskeletal: Negative for back pain and neck pain.  Neurological: Positive for weakness. Negative for dizziness.  All other systems reviewed and are negative.     Allergies  Review of patient's allergies indicates no known allergies.  Home Medications   Prior to Admission medications   Not on File   BP 127/85  Pulse 94  Temp(Src) 97.7 F (36.5 C) (Oral)  Resp 15  Ht 6' (1.829 m)  Wt 160 lb (72.576 kg)  BMI 21.70 kg/m2  SpO2 100% Physical Exam  Vitals reviewed. Constitutional: He is oriented to person, place, and time. He appears well-developed and well-nourished. No distress.  HENT:  Head: Normocephalic and atraumatic.  Mouth/Throat: Oropharynx is clear and moist. No oropharyngeal exudate.  Eyes: Conjunctivae and EOM are normal. Pupils are equal, round, and reactive to light. Right eye exhibits no discharge. Left eye exhibits no discharge.  Neck: Normal range of motion. Neck supple. No tracheal deviation present.  Negative neck stiffness Negative nuchal rigidity Negative cervical lymphadenopathy   Cardiovascular: Normal rate, regular rhythm and normal heart sounds.  Exam reveals no friction rub.   No murmur heard. Pulses:      Radial pulses are 2+ on the right side, and 2+ on the left side.       Dorsalis pedis pulses are 2+ on the right side, and 2+ on the left side.  Cap refill < 3 seconds Negative leg swelling or pitting edema noted  Pulmonary/Chest: Effort normal and breath sounds normal. No respiratory distress. He has no wheezes. He has no rales.  Abdominal: Soft. Normal appearance and bowel sounds are normal. He exhibits no distension. There is tenderness in the right upper quadrant and epigastric  area. There is guarding and positive Murphy's sign. There is no rebound and no tenderness at McBurney's point.    Negative abdominal distention Discomfort upon palpation to the right upper quadrant and epigastric region-positive guarding noted Positive Murphy's  sign Negative McBurney's point  Genitourinary:  Rectal Exam: Negative swelling, erythema, inflammation, lesions, sores, hemorrhoids noted to the anus. Negative active bleeding noted. Negative fissures. Good sphincter tone. Negative masses palpated. Negative pain upon palpation to the rectum. Patient tolerated procedure well. Negative blood on glove. Brown stools noted on glove. Exam chaperoned with tech.  Musculoskeletal: Normal range of motion.  Full ROM to upper and lower extremities without difficulty noted, negative ataxia noted.  Lymphadenopathy:    He has no cervical adenopathy.  Neurological: He is alert and oriented to person, place, and time. No cranial nerve deficit. He exhibits normal muscle tone. Coordination normal.  Skin: Skin is warm and dry. No rash noted. He is not diaphoretic. No erythema.  Psychiatric: He has a normal mood and affect. His behavior is normal. Thought content normal.    ED Course  Procedures (including critical care time)  12:59 PM This provider spoke with Dr. Doug Sou, Internal Medicine Teaching Services - discussed case, history, labs, imaging in great detail. Patient to be admitted to the hospital for colon cancer.  1:39 PM This provider spoke with Amina, general surgery physician assistant-discussed case, history, presentation, labs, imaging in great detail-general surgery to see.  1:45 PM This provider spoke with Dr. Benson Norway, gastroenterologist-discussed case, history, presentation, labs, imaging great detail-gastroenterology to see and assess patient.  Results for orders placed during the hospital encounter of 10/03/13  CBC WITH DIFFERENTIAL      Result Value Ref Range   WBC 11.7 (*) 4.0 - 10.5 K/uL   RBC 4.06 (*) 4.22 - 5.81 MIL/uL   Hemoglobin 6.7 (*) 13.0 - 17.0 g/dL   HCT 23.2 (*) 39.0 - 52.0 %   MCV 57.1 (*) 78.0 - 100.0 fL   MCH 16.5 (*) 26.0 - 34.0 pg   MCHC 28.9 (*) 30.0 - 36.0 g/dL   RDW 19.1 (*) 11.5 - 15.5 %   Platelets 636 (*) 150 - 400  K/uL   Neutrophils Relative % 80 (*) 43 - 77 %   Lymphocytes Relative 13  12 - 46 %   Monocytes Relative 7  3 - 12 %   Eosinophils Relative 0  0 - 5 %   Basophils Relative 0  0 - 1 %   Neutro Abs 9.4 (*) 1.7 - 7.7 K/uL   Lymphs Abs 1.5  0.7 - 4.0 K/uL   Monocytes Absolute 0.8  0.1 - 1.0 K/uL   Eosinophils Absolute 0.0  0.0 - 0.7 K/uL   Basophils Absolute 0.0  0.0 - 0.1 K/uL   RBC Morphology ELLIPTOCYTES     Smear Review LARGE PLATELETS PRESENT    COMPREHENSIVE METABOLIC PANEL      Result Value Ref Range   Sodium 134 (*) 137 - 147 mEq/L   Potassium 3.7  3.7 - 5.3 mEq/L   Chloride 94 (*) 96 - 112 mEq/L   CO2 21  19 - 32 mEq/L   Glucose, Bld 110 (*) 70 - 99 mg/dL   BUN 13  6 - 23 mg/dL   Creatinine, Ser 0.85  0.50 - 1.35 mg/dL   Calcium 9.1  8.4 - 10.5 mg/dL   Total Protein 6.8  6.0 - 8.3 g/dL   Albumin 3.1 (*) 3.5 - 5.2 g/dL  AST 13  0 - 37 U/L   ALT 9  0 - 53 U/L   Alkaline Phosphatase 71  39 - 117 U/L   Total Bilirubin 0.3  0.3 - 1.2 mg/dL   GFR calc non Af Amer >90  >90 mL/min   GFR calc Af Amer >90  >90 mL/min  LIPASE, BLOOD      Result Value Ref Range   Lipase 9 (*) 11 - 59 U/L  URINALYSIS, ROUTINE W REFLEX MICROSCOPIC      Result Value Ref Range   Color, Urine AMBER (*) YELLOW   APPearance HAZY (*) CLEAR   Specific Gravity, Urine 1.033 (*) 1.005 - 1.030   pH 6.0  5.0 - 8.0   Glucose, UA NEGATIVE  NEGATIVE mg/dL   Hgb urine dipstick NEGATIVE  NEGATIVE   Bilirubin Urine SMALL (*) NEGATIVE   Ketones, ur 15 (*) NEGATIVE mg/dL   Protein, ur 100 (*) NEGATIVE mg/dL   Urobilinogen, UA 1.0  0.0 - 1.0 mg/dL   Nitrite NEGATIVE  NEGATIVE   Leukocytes, UA NEGATIVE  NEGATIVE  TROPONIN I      Result Value Ref Range   Troponin I <0.30  <0.30 ng/mL  ETHANOL      Result Value Ref Range   Alcohol, Ethyl (B) <11  0 - 11 mg/dL  URINE MICROSCOPIC-ADD ON      Result Value Ref Range   WBC, UA 0-2  <3 WBC/hpf   RBC / HPF 0-2  <3 RBC/hpf   Bacteria, UA FEW (*) RARE   Casts  GRANULAR CAST (*) NEGATIVE   Sperm, UA PRESENT     Urine-Other MUCOUS PRESENT    POC OCCULT BLOOD, ED      Result Value Ref Range   Fecal Occult Bld NEGATIVE  NEGATIVE  I-STAT CG4 LACTIC ACID, ED      Result Value Ref Range   Lactic Acid, Venous 1.22  0.5 - 2.2 mmol/L  TYPE AND SCREEN      Result Value Ref Range   ABO/RH(D) O POS     Antibody Screen NEG     Sample Expiration 10/06/2013     Unit Number [id number]     Blood Component Type RED CELLS,LR     Unit division 00     Status of Unit ISSUED     Transfusion Status OK TO TRANSFUSE     Crossmatch Result Compatible     Unit Number [id number]     Blood Component Type RBC LR PHER2     Unit division 00     Status of Unit ALLOCATED     Transfusion Status OK TO TRANSFUSE     Crossmatch Result Compatible    ABO/RH      Result Value Ref Range   ABO/RH(D) O POS    PREPARE RBC (CROSSMATCH)      Result Value Ref Range   Order Confirmation ORDER PROCESSED BY BLOOD BANK      Labs Review Labs Reviewed  CBC WITH DIFFERENTIAL - Abnormal; Notable for the following:    WBC 11.7 (*)    RBC 4.06 (*)    Hemoglobin 6.7 (*)    HCT 23.2 (*)    MCV 57.1 (*)    MCH 16.5 (*)    MCHC 28.9 (*)    RDW 19.1 (*)    Platelets 636 (*)    Neutrophils Relative % 80 (*)    Neutro Abs 9.4 (*)    All other components within normal  limits  COMPREHENSIVE METABOLIC PANEL - Abnormal; Notable for the following:    Sodium 134 (*)    Chloride 94 (*)    Glucose, Bld 110 (*)    Albumin 3.1 (*)    All other components within normal limits  LIPASE, BLOOD - Abnormal; Notable for the following:    Lipase 9 (*)    All other components within normal limits  URINALYSIS, ROUTINE W REFLEX MICROSCOPIC - Abnormal; Notable for the following:    Color, Urine AMBER (*)    APPearance HAZY (*)    Specific Gravity, Urine 1.033 (*)    Bilirubin Urine SMALL (*)    Ketones, ur 15 (*)    Protein, ur 100 (*)    All other components within normal limits  URINE  MICROSCOPIC-ADD ON - Abnormal; Notable for the following:    Bacteria, UA FEW (*)    Casts GRANULAR CAST (*)    All other components within normal limits  TROPONIN I  ETHANOL  POC OCCULT BLOOD, ED  I-STAT CG4 LACTIC ACID, ED  TYPE AND SCREEN  ABO/RH  PREPARE RBC (CROSSMATCH)    Imaging Review Ct Abdomen Pelvis W Contrast  10/03/2013   CLINICAL DATA:  Abdominal pain, vomiting  EXAM: CT ABDOMEN AND PELVIS WITH CONTRAST  TECHNIQUE: Multidetector CT imaging of the abdomen and pelvis was performed using the standard protocol following bolus administration of intravenous contrast.  CONTRAST:  16mL OMNIPAQUE IOHEXOL 300 MG/ML  SOLN  COMPARISON:  None.  FINDINGS: Lung bases are clear.  No pericardial fluid.  No focal hepatic lesion gallbladder, pancreas, spleen, adrenal glands, and kidneys are normal.  Stomach appears normal. There is some contraction through the gastric body which is likely physiologic. There are dilated loops of the small bowel distally. Poor progression of the oral contrast. The distal small bowel is dilated up to 3.3 cm. There is irregular thickening of the cecum involving the cecal tip, terminal ileum, and circumferentially involving the distal cecum over approximately 7.5 cm segment. The appendix is identified and is normal caliber. There several small lymph nodes along the pericolic gutter inferior to the cecal tip (image 57, series 2 for example lymph node measures 7 mm). There is a large a rounded lymph node in the ileocecal mesentery measuring 10 mm (image 46). The more distal ascending ascending, transverse, and descending colon are normal. There is minimal stool in the distal colon.  Abdominal or is normal caliber. No retroperitoneal lymphadenopathy. No periportal lymphadenopathy.  No free fluid in the pelvis. Edge of free fluid in the pelvis and small mildly the mesentery.  The prostate gland is normal.  No pelvic lymphadenopathy.  IMPRESSION: 1. Irregular masslike thickening of  the distal cecum is most consistent colorectal carcinoma. 2. Distal small bowel obstruction with obstruction at level of the ileocecal valve. 3. 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 4. Smaller free fluid within the abdomen pelvis related to the bowel obstruction. 5. Contraction through the gastric body is likely physiologic nondistention. Findings conveyed toMARISSA Shirl Weir on 10/03/2013  at12:27.   Electronically Signed   By: Suzy Bouchard M.D.   On: 10/03/2013 12:28   US Abdomen Limited Ruq  10/03/2013   CLINICAL DATA:  Right upper quadrant pain.  EXAM: US ABDOMEN LIMITED - RIGHT UPPER QUADRANT  COMPARISON:  None.  FINDINGS: Gallbladder:  No gallstones or wall thickening visualized. No sonographic Murphy sign noted.  Common bile duct:  Diameter: Normal measuring 1.3 mm  Liver:  Heterogeneous echotexture  without focal mass. No overall enlargement.  Trace ascites.  IMPRESSION: Trace ascites. Heterogeneous liver echotexture without focal mass. No gallstones or signs of acute cholecystitis.   Electronically Signed   By: Rolla Flatten M.D.   On: 10/03/2013 11:34     EKG Interpretation   Date/Time:  Thursday October 03 2013 09:49:32 EDT Ventricular Rate:  89 PR Interval:  131 QRS Duration: 86 QT Interval:  361 QTC Calculation: 439 R Axis:   59 Text Interpretation:  Sinus rhythm Abnormal R-wave progression, early  transition No significant change was found Confirmed by Wyvonnia Dusky  MD,  STEPHEN [phone]) on 10/03/2013 10:14:33 AM      MDM   Final diagnoses:  Colon cancer  SBO (small bowel obstruction)  Anemia   Medications  iohexol (OMNIPAQUE) 300 MG/ML solution 25 mL (25 mLs Oral Contrast Given 10/03/13 1002)  sodium chloride 0.9 % bolus 1,000 mL (0 mLs Intravenous Stopped 10/03/13 1122)  ondansetron (ZOFRAN) injection 4 mg (4 mg Intravenous Given 10/03/13 1000)  iohexol (OMNIPAQUE) 300 MG/ML solution 100 mL (100 mLs Intravenous Contrast Given 10/03/13 1138)   Filed Vitals:    10/03/13 0945 10/03/13 1041 10/03/13 1130 10/03/13 1215  BP: 116/79 123/82 129/85 127/85  Pulse: 95 76 93 94  Temp:      TempSrc:      Resp: 15 18 13 15   Height:      Weight:      SpO2: 100% 100% 100% 100%    Patient presenting to the ED with abdominal pain does been ongoing for the past 2 weeks localize the upper abdomen described as a "knot" sensation that is a constant pulling without radiation. Stated the pain worsens with eating. Associated symptoms are nausea and vomiting-reported that he had one episode of emesis on Tuesday mainly of food contents after eating [place]. Reported that he drinks at least one can of beer per day. And states that he smokes one pack per day of cigarettes. Alert and oriented. GCS 15. Heart rate and rhythm normal. Lungs good auscultation to upper and lower lobes bilaterally. Patient stable to speak in full sentences without difficulty, negative use of accessory muscles. Radial and DP pulses 2+ bilaterally. Cap refill less than 3 seconds. Negative swelling or pitting edema identified to lower extremities. Negative abdominal distention noted. Bowel sounds normal active in all 4 quadrants. Discomfort upon palpation to the abdomen generalized but most discomfort to the right upper quadrant and epigastric region. Positive guarding noted. Positive Murphy's sign. Negative McBurney's point. EKG noted normal sinus rhythm with a heart rate of 89 beats per minute-no significant changes identified. CBC noted elevated white blood cell count of 11.7. Hemoglobin 6.7 with hematocrit of 23.2. CMP noted hyponatremia of 134. Low chloride of 94. Kidney functioning well, liver function well. Lactic acid negative elevation. Ethanol negative elevation. Fecal occult negative. Urinalysis negative nitrites and leukocytes identified, negative hemoglobin-few bacteria identified-negative pyuria noted. Ultrasounds noted to the right upper quadrant with trace ascites noted.  Heterogeneous liver echo texture without focal mass. No gallstones or signs of acute cholecystitis. CT abdomen and pelvis identified irregular masslike thickening of the distal cecum that is most consistent with colorectal carcinoma. Distal small bowel structure in with obstruction at the level of ileocecal valve. Smaller fluid within the abdomen related to bowel structure. Contraction to the gastric body is likely physiological nondistention. Local nodal metastasis in the ileocecal mesentery. Patient presenting to the ED with new onset and findings of colorectal carcinoma with a small  bowel obstruction near the ileocecal valve. Patient appears comfortable, NG tube now placed. IV fluids administered. No episodes of emesis while in ED setting. Suspicion of anemia and drop in hemoglobin due to obstruction. Discussed labs and imaging in great detail with patient. Discussed plan for admission with patient-agreed to plan. Patient stable for transfer. Patient to be admitted to telemetry floor under the care of internal medicine teaching services. General surgery to see patient. Spoke with Dr. Benson Norway, gastroenterology-gastroenterology to see patient as well.  Jamse Mead, PA-C 10/03/13 1816

## 2013-10-03 NOTE — ED Notes (Signed)
Emina PA with [facility] at bedside.

## 2013-10-03 NOTE — ED Notes (Signed)
Pt finished drinking contrast. CT tech notified.

## 2013-10-03 NOTE — ED Notes (Signed)
Patient presents to ED with complaints of abdominal pain for the past week. Pt states he vomited one time Tuesday. NAD.

## 2013-10-03 NOTE — ED Notes (Addendum)
Pt transported to 6N room 16 via stretcher by Otila Kluver NT, report given to [employer]

## 2013-10-03 NOTE — ED Notes (Signed)
Pt drank half of contrast at this time.Marland Kitchen

## 2013-10-04 ENCOUNTER — Encounter (HOSPITAL_COMMUNITY): Payer: Medicaid Other | Admitting: Certified Registered"

## 2013-10-04 ENCOUNTER — Inpatient Hospital Stay (HOSPITAL_COMMUNITY): Payer: Medicaid Other | Admitting: Certified Registered"

## 2013-10-04 ENCOUNTER — Encounter (HOSPITAL_COMMUNITY): Payer: Self-pay | Admitting: Certified Registered"

## 2013-10-04 ENCOUNTER — Encounter (HOSPITAL_COMMUNITY): Admission: EM | Disposition: A | Discharge status: Home / Self Care | Payer: Self-pay | Source: Home / Self Care

## 2013-10-04 DIAGNOSIS — C189 Malignant neoplasm of colon, unspecified: Secondary | ICD-10-CM

## 2013-10-04 HISTORY — PX: PARTIAL COLECTOMY: SHX5273

## 2013-10-04 LAB — CBC
HCT: 25.1 % — ABNORMAL LOW (ref 39.0–52.0)
HCT: 34.5 % — ABNORMAL LOW (ref 39.0–52.0)
HEMOGLOBIN: 7.6 g/dL — AB (ref 13.0–17.0)
Hemoglobin: 11 g/dL — ABNORMAL LOW (ref 13.0–17.0)
MCH: 18.9 pg — ABNORMAL LOW (ref 26.0–34.0)
MCH: 21 pg — ABNORMAL LOW (ref 26.0–34.0)
MCHC: 30.3 g/dL (ref 30.0–36.0)
MCHC: 31.9 g/dL (ref 30.0–36.0)
MCV: 62.3 fL — ABNORMAL LOW (ref 78.0–100.0)
MCV: 65.8 fL — ABNORMAL LOW (ref 78.0–100.0)
Platelets: 492 10*3/uL — ABNORMAL HIGH (ref 150–400)
Platelets: 406 K/uL — ABNORMAL HIGH (ref 150–400)
RBC: 4.03 MIL/uL — AB (ref 4.22–5.81)
RBC: 5.24 MIL/uL (ref 4.22–5.81)
RDW: 23.2 % — ABNORMAL HIGH (ref 11.5–15.5)
RDW: 25.3 % — ABNORMAL HIGH (ref 11.5–15.5)
WBC: 15.1 10*3/uL — ABNORMAL HIGH (ref 4.0–10.5)
WBC: 22.5 K/uL — ABNORMAL HIGH (ref 4.0–10.5)

## 2013-10-04 LAB — CREATININE, SERUM: CREATININE: 0.87 mg/dL (ref 0.50–1.35)

## 2013-10-04 LAB — PREPARE RBC (CROSSMATCH)

## 2013-10-04 SURGERY — COLECTOMY, PARTIAL
Anesthesia: General | Site: Abdomen | Laterality: Right

## 2013-10-04 MED ORDER — SODIUM CHLORIDE 0.9 % IV SOLN
INTRAVENOUS | Status: DC | PRN
  Administered 2013-10-04 (×2): via INTRAVENOUS

## 2013-10-04 MED ORDER — CEFAZOLIN SODIUM-DEXTROSE 2-3 GM-% IV SOLR
INTRAVENOUS | Status: DC | PRN
  Administered 2013-10-04: 2 g via INTRAVENOUS

## 2013-10-04 MED ORDER — FENTANYL CITRATE 0.05 MG/ML IJ SOLN
INTRAMUSCULAR | Status: AC
  Filled 2013-10-04: qty 5

## 2013-10-04 MED ORDER — MORPHINE SULFATE (PF) 1 MG/ML IV SOLN
INTRAVENOUS | Status: DC
  Administered 2013-10-04: 25.5 mg via INTRAVENOUS
  Administered 2013-10-04 (×2): via INTRAVENOUS
  Administered 2013-10-04: 18.75 mg via INTRAVENOUS
  Administered 2013-10-04: 1.5 mg via INTRAVENOUS
  Administered 2013-10-05: 03:00:00 via INTRAVENOUS
  Administered 2013-10-05: 6 mg via INTRAVENOUS
  Administered 2013-10-05: 21.47 mg via INTRAVENOUS
  Administered 2013-10-05: 15 mg via INTRAVENOUS
  Administered 2013-10-05: 25.11 mg via INTRAVENOUS
  Administered 2013-10-05: 24 mg via INTRAVENOUS
  Administered 2013-10-06: 01:00:00 via INTRAVENOUS
  Administered 2013-10-06: 6 mg via INTRAVENOUS
  Administered 2013-10-06: 4.5 mg via INTRAVENOUS
  Administered 2013-10-06: 16.45 mg via INTRAVENOUS
  Administered 2013-10-06: 21 mg via INTRAVENOUS
  Filled 2013-10-04 (×7): qty 25

## 2013-10-04 MED ORDER — DIPHENHYDRAMINE HCL 50 MG/ML IJ SOLN
12.5000 mg | Freq: Four times a day (QID) | INTRAMUSCULAR | Status: DC | PRN
  Administered 2013-10-05: 12.5 mg via INTRAVENOUS
  Filled 2013-10-04: qty 1

## 2013-10-04 MED ORDER — HYDROMORPHONE HCL PF 1 MG/ML IJ SOLN
0.2500 mg | INTRAMUSCULAR | Status: DC | PRN
  Administered 2013-10-04 (×4): 0.5 mg via INTRAVENOUS

## 2013-10-04 MED ORDER — MIDAZOLAM HCL 2 MG/2ML IJ SOLN
INTRAMUSCULAR | Status: AC
  Filled 2013-10-04: qty 2

## 2013-10-04 MED ORDER — ROCURONIUM BROMIDE 50 MG/5ML IV SOLN
INTRAVENOUS | Status: AC
  Filled 2013-10-04: qty 1

## 2013-10-04 MED ORDER — DIPHENHYDRAMINE HCL 12.5 MG/5ML PO ELIX: 12.5000 mg | ORAL_SOLUTION | Freq: Four times a day (QID) | ORAL | Status: DC | PRN

## 2013-10-04 MED ORDER — MORPHINE SULFATE (PF) 1 MG/ML IV SOLN
INTRAVENOUS | Status: AC
  Administered 2013-10-04: 19:00:00
  Filled 2013-10-04: qty 25

## 2013-10-04 MED ORDER — PROPOFOL 10 MG/ML IV BOLUS
INTRAVENOUS | Status: DC | PRN
  Administered 2013-10-04: 50 mg via INTRAVENOUS
  Administered 2013-10-04: 150 mg via INTRAVENOUS

## 2013-10-04 MED ORDER — NEOSTIGMINE METHYLSULFATE 1 MG/ML IJ SOLN
INTRAMUSCULAR | Status: AC
  Filled 2013-10-04: qty 10

## 2013-10-04 MED ORDER — CEFAZOLIN SODIUM-DEXTROSE 2-3 GM-% IV SOLR
INTRAVENOUS | Status: AC
  Filled 2013-10-04: qty 50

## 2013-10-04 MED ORDER — GLYCOPYRROLATE 0.2 MG/ML IJ SOLN
INTRAMUSCULAR | Status: AC
  Filled 2013-10-04: qty 2

## 2013-10-04 MED ORDER — WHITE PETROLATUM GEL
Status: AC
  Administered 2013-10-04: 16:00:00
  Filled 2013-10-04: qty 5

## 2013-10-04 MED ORDER — ONDANSETRON HCL 4 MG/2ML IJ SOLN
INTRAMUSCULAR | Status: DC | PRN
Start: 2013-10-04 — End: 2013-10-04
  Administered 2013-10-04: 4 mg via INTRAVENOUS

## 2013-10-04 MED ORDER — 0.9 % SODIUM CHLORIDE (POUR BTL) OPTIME
TOPICAL | Status: DC | PRN
  Administered 2013-10-04: 3000 mL

## 2013-10-04 MED ORDER — HYDROMORPHONE HCL PF 1 MG/ML IJ SOLN
INTRAMUSCULAR | Status: AC
  Administered 2013-10-04: 0.5 mg via INTRAVENOUS
  Filled 2013-10-04: qty 1

## 2013-10-04 MED ORDER — LIDOCAINE HCL (CARDIAC) 20 MG/ML IV SOLN
INTRAVENOUS | Status: AC
  Filled 2013-10-04: qty 5

## 2013-10-04 MED ORDER — SUCCINYLCHOLINE CHLORIDE 20 MG/ML IJ SOLN
INTRAMUSCULAR | Status: DC | PRN
  Administered 2013-10-04: 100 mg via INTRAVENOUS

## 2013-10-04 MED ORDER — LACTATED RINGERS IV SOLN
INTRAVENOUS | Status: DC | PRN
  Administered 2013-10-04 (×3): via INTRAVENOUS

## 2013-10-04 MED ORDER — MIDAZOLAM HCL 5 MG/5ML IJ SOLN
INTRAMUSCULAR | Status: DC | PRN
  Administered 2013-10-04: 2 mg via INTRAVENOUS

## 2013-10-04 MED ORDER — CEFOXITIN SODIUM 1 G IV SOLR
1.0000 g | Freq: Four times a day (QID) | INTRAVENOUS | Status: AC
  Administered 2013-10-04 – 2013-10-05 (×3): 1 g via INTRAVENOUS
  Filled 2013-10-04 (×3): qty 1

## 2013-10-04 MED ORDER — FENTANYL CITRATE 0.05 MG/ML IJ SOLN
INTRAMUSCULAR | Status: DC | PRN
  Administered 2013-10-04: 50 ug via INTRAVENOUS
  Administered 2013-10-04: 100 ug via INTRAVENOUS
  Administered 2013-10-04 (×8): 50 ug via INTRAVENOUS

## 2013-10-04 MED ORDER — LIDOCAINE HCL (CARDIAC) 20 MG/ML IV SOLN
INTRAVENOUS | Status: DC | PRN
  Administered 2013-10-04: 60 mg via INTRAVENOUS

## 2013-10-04 MED ORDER — NALOXONE HCL 0.4 MG/ML IJ SOLN
0.4000 mg | INTRAMUSCULAR | Status: DC | PRN
  Filled 2013-10-04: qty 1

## 2013-10-04 MED ORDER — NEOSTIGMINE METHYLSULFATE 1 MG/ML IJ SOLN
INTRAMUSCULAR | Status: DC | PRN
  Administered 2013-10-04: 3 mg via INTRAVENOUS

## 2013-10-04 MED ORDER — SODIUM CHLORIDE 0.9 % IJ SOLN
9.0000 mL | INTRAMUSCULAR | Status: DC | PRN
Start: 2013-10-04 — End: 2013-10-06

## 2013-10-04 MED ORDER — ESMOLOL HCL 10 MG/ML IV SOLN
INTRAVENOUS | Status: DC | PRN
  Administered 2013-10-04 (×2): 10 mg via INTRAVENOUS

## 2013-10-04 MED ORDER — HYDROMORPHONE HCL PF 1 MG/ML IJ SOLN
INTRAMUSCULAR | Status: AC
  Filled 2013-10-04: qty 1

## 2013-10-04 MED ORDER — ENOXAPARIN SODIUM 40 MG/0.4ML ~~LOC~~ SOLN
40.0000 mg | SUBCUTANEOUS | Status: DC
  Administered 2013-10-05 – 2013-10-08 (×4): 40 mg via SUBCUTANEOUS
  Filled 2013-10-04 (×6): qty 0.4

## 2013-10-04 MED ORDER — GLYCOPYRROLATE 0.2 MG/ML IJ SOLN
INTRAMUSCULAR | Status: DC | PRN
  Administered 2013-10-04: 0.4 mg via INTRAVENOUS

## 2013-10-04 MED ORDER — ONDANSETRON HCL 4 MG/2ML IJ SOLN
4.0000 mg | Freq: Four times a day (QID) | INTRAMUSCULAR | Status: DC | PRN
Start: 2013-10-04 — End: 2013-10-04

## 2013-10-04 MED ORDER — ROCURONIUM BROMIDE 100 MG/10ML IV SOLN
INTRAVENOUS | Status: DC | PRN
  Administered 2013-10-04: 10 mg via INTRAVENOUS
  Administered 2013-10-04: 5 mg via INTRAVENOUS
  Administered 2013-10-04: 30 mg via INTRAVENOUS
  Administered 2013-10-04: 5 mg via INTRAVENOUS

## 2013-10-04 MED ORDER — PROPOFOL 10 MG/ML IV BOLUS
INTRAVENOUS | Status: AC
  Filled 2013-10-04: qty 20

## 2013-10-04 SURGICAL SUPPLY — 59 items
BLADE SURG ROTATE 9660 (MISCELLANEOUS) IMPLANT
CANISTER SUCTION 2500CC (MISCELLANEOUS) ×3 IMPLANT
CHLORAPREP W/TINT 26ML (MISCELLANEOUS) ×3 IMPLANT
CONT SPEC 4OZ CLIKSEAL STRL BL (MISCELLANEOUS) ×3 IMPLANT
COVER MAYO STAND STRL (DRAPES) ×6 IMPLANT
DRAPE LAPAROSCOPIC ABDOMINAL (DRAPES) ×6 IMPLANT
DRAPE PROXIMA HALF (DRAPES) ×6 IMPLANT
DRAPE UTILITY 15X26 W/TAPE STR (DRAPE) ×15 IMPLANT
DRAPE WARM FLUID 44X44 (DRAPE) ×3 IMPLANT
DRSG OPSITE POSTOP 4X10 (GAUZE/BANDAGES/DRESSINGS) ×3 IMPLANT
DRSG OPSITE POSTOP 4X8 (GAUZE/BANDAGES/DRESSINGS) IMPLANT
ELECT BLADE 6.5 EXT (BLADE) ×3 IMPLANT
ELECT CAUTERY BLADE 6.4 (BLADE) ×6 IMPLANT
ELECT REM PT RETURN 9FT ADLT (ELECTROSURGICAL) ×3
ELECTRODE REM PT RTRN 9FT ADLT (ELECTROSURGICAL) ×1 IMPLANT
GLOVE BIO SURGEON STRL SZ7 (GLOVE) ×6 IMPLANT
GLOVE BIOGEL PI IND STRL 6 (GLOVE) ×2 IMPLANT
GLOVE BIOGEL PI IND STRL 6.5 (GLOVE) ×1 IMPLANT
GLOVE BIOGEL PI IND STRL 7.0 (GLOVE) ×1 IMPLANT
GLOVE BIOGEL PI IND STRL 7.5 (GLOVE) ×2 IMPLANT
GLOVE BIOGEL PI INDICATOR 6 (GLOVE) ×4
GLOVE BIOGEL PI INDICATOR 6.5 (GLOVE) ×2
GLOVE BIOGEL PI INDICATOR 7.0 (GLOVE) ×2
GLOVE BIOGEL PI INDICATOR 7.5 (GLOVE) ×4
GLOVE SURG SS PI 6.5 STRL IVOR (GLOVE) ×3 IMPLANT
GOWN STRL REUS W/ TWL LRG LVL3 (GOWN DISPOSABLE) ×6 IMPLANT
GOWN STRL REUS W/TWL LRG LVL3 (GOWN DISPOSABLE) ×12
KIT BASIN OR (CUSTOM PROCEDURE TRAY) ×3 IMPLANT
KIT ROOM TURNOVER OR (KITS) ×3 IMPLANT
LEGGING LITHOTOMY PAIR STRL (DRAPES) IMPLANT
LIGASURE IMPACT 36 18CM CVD LR (INSTRUMENTS) ×6 IMPLANT
NS IRRIG 1000ML POUR BTL (IV SOLUTION) ×6 IMPLANT
PACK GENERAL/GYN (CUSTOM PROCEDURE TRAY) ×3 IMPLANT
PAD ARMBOARD 7.5X6 YLW CONV (MISCELLANEOUS) ×6 IMPLANT
PENCIL BUTTON HOLSTER BLD 10FT (ELECTRODE) ×3 IMPLANT
RELOAD PROXIMATE 75MM BLUE (ENDOMECHANICALS) ×6 IMPLANT
SPONGE LAP 18X18 X RAY DECT (DISPOSABLE) ×6 IMPLANT
STAPLER GUN LINEAR PROX 60 (STAPLE) ×3 IMPLANT
STAPLER PROXIMATE 75MM BLUE (STAPLE) ×3 IMPLANT
STAPLER VISISTAT 35W (STAPLE) ×3 IMPLANT
SUCTION POOLE TIP (SUCTIONS) ×3 IMPLANT
SURGILUBE 2OZ TUBE FLIPTOP (MISCELLANEOUS) IMPLANT
SUT PDS AB 1 TP1 96 (SUTURE) ×6 IMPLANT
SUT PROLENE 2 0 CT2 30 (SUTURE) IMPLANT
SUT PROLENE 2 0 KS (SUTURE) IMPLANT
SUT SILK 2 0 SH CR/8 (SUTURE) ×3 IMPLANT
SUT SILK 2 0 TIES 10X30 (SUTURE) ×3 IMPLANT
SUT SILK 3 0 SH CR/8 (SUTURE) ×3 IMPLANT
SUT SILK 3 0 TIES 10X30 (SUTURE) ×3 IMPLANT
SUT VIC AB 3-0 SH 18 (SUTURE) IMPLANT
SYR BULB IRRIGATION 50ML (SYRINGE) ×3 IMPLANT
TOWEL OR 17X26 10 PK STRL BLUE (TOWEL DISPOSABLE) ×6 IMPLANT
TRAY FOLEY CATH 16FRSI W/METER (SET/KITS/TRAYS/PACK) IMPLANT
TRAY PROCTOSCOPIC FIBER OPTIC (SET/KITS/TRAYS/PACK) IMPLANT
TUBE CONNECTING 12'X1/4 (SUCTIONS) ×1
TUBE CONNECTING 12X1/4 (SUCTIONS) ×2 IMPLANT
UNDERPAD 30X30 INCONTINENT (UNDERPADS AND DIAPERS) IMPLANT
WATER STERILE IRR 1000ML POUR (IV SOLUTION) ×3 IMPLANT
YANKAUER SUCT BULB TIP NO VENT (SUCTIONS) ×3 IMPLANT

## 2013-10-04 NOTE — Progress Notes (Signed)
Subjective: Received 2 units PRBC last night - Hgb only up to 7.6 Comfortable; resting well No nausea or vomiting Dr. Benson Norway - unable to prep; no benefit to attempting colonoscopy  Objective: Vital signs in last 24 hours: Temp:  [97.7 F (36.5 C)-98.8 F (37.1 C)] 98.2 F (36.8 C) (04/17 0604) Pulse Rate:  [70-108] 70 (04/17 0604) Resp:  [9-23] 17 (04/17 0604) BP: (101-144)/(64-102) 116/79 mmHg (04/17 0604) SpO2:  [100 %] 100 % (04/17 0604) Weight:  [153 lb 4.8 oz (69.536 kg)-160 lb (72.576 kg)] 153 lb 4.8 oz (69.536 kg) (04/16 1701) Last BM Date: 09/30/13  Intake/Output from previous day: 04/16 0701 - 04/17 0700 In: 1441.7 [I.V.:1441.7] Out: 750 [Urine:750] Intake/Output this shift:    General appearance: alert, cooperative and no distress Resp: clear to auscultation bilaterally Cardio: regular rate and rhythm, S1, S2 normal, no murmur, click, rub or gallop GI: mildly distended; + bowel sounds  Lab Results:   Recent Labs  10/03/13 0900 10/03/13 1716 10/04/13 0628  WBC 11.7*  --  15.1*  HGB 6.7* 7.6* 7.6*  HCT 23.2* 25.2* 25.1*  PLT 636*  --  492*   BMET  Recent Labs  10/03/13 0900  NA 134*  K 3.7  CL 94*  CO2 21  GLUCOSE 110*  BUN 13  CREATININE 0.85  CALCIUM 9.1   PT/INR No results found for this basename: LABPROT, INR,  in the last 72 hours ABG No results found for this basename: PHART, PCO2, PO2, HCO3,  in the last 72 hours  Studies/Results: Ct Abdomen Pelvis W Contrast  10/03/2013   ADDENDUM REPORT: 10/03/2013 16:55  ADDENDUM: A moderate 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   Electronically Signed   By: Hassan Rowan M.D.   On: 10/03/2013 16:55   10/03/2013   CLINICAL DATA:  Abdominal pain, vomiting  EXAM: CT ABDOMEN AND PELVIS WITH CONTRAST  TECHNIQUE: Multidetector CT imaging of the abdomen and pelvis was performed using the standard protocol  following bolus administration of intravenous contrast.  CONTRAST:  131mL OMNIPAQUE IOHEXOL 300 MG/ML  SOLN  COMPARISON:  None.  FINDINGS: Lung bases are clear.  No pericardial fluid.  No focal hepatic lesion gallbladder, pancreas, spleen, adrenal glands, and kidneys are normal.  Stomach appears normal. There is some contraction through the gastric body which is likely physiologic. There are dilated loops of the small bowel distally. Poor progression of the oral contrast. The distal small bowel is dilated up to 3.3 cm. There is irregular thickening of the cecum involving the cecal tip, terminal ileum, and circumferentially involving the distal cecum over approximately 7.5 cm segment. The appendix is identified and is normal caliber. There several small lymph nodes along the pericolic gutter inferior to the cecal tip (image 57, series 2 for example lymph node measures 7 mm). There is a large a rounded lymph node in the ileocecal mesentery measuring 10 mm (image 46). The more distal ascending ascending, transverse, and descending colon are normal. There is minimal stool in the distal colon.  Abdominal or is normal caliber. No retroperitoneal lymphadenopathy. No periportal lymphadenopathy.  No free fluid in the pelvis. Edge of free fluid in the pelvis and small mildly the mesentery.  The prostate gland is normal.  No pelvic lymphadenopathy.  IMPRESSION: 1. Irregular masslike thickening of the distal cecum is most consistent colorectal carcinoma. 2. Distal small bowel obstruction with obstruction at level of the ileocecal valve.  3. 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 4. Smaller free fluid within the abdomen pelvis related to the bowel obstruction. 5. Contraction through the gastric body is likely physiologic nondistention. Findings conveyed toMARISSA SCIACCA on 10/03/2013  at12:27.  Electronically Signed: By: Suzy Bouchard M.D. On: 10/03/2013 12:28   US Abdomen Limited Ruq  10/03/2013   CLINICAL  DATA:  Right upper quadrant pain.  EXAM: US ABDOMEN LIMITED - RIGHT UPPER QUADRANT  COMPARISON:  None.  FINDINGS: Gallbladder:  No gallstones or wall thickening visualized. No sonographic Murphy sign noted.  Common bile duct:  Diameter: Normal measuring 1.3 mm  Liver:  Heterogeneous echotexture without focal mass. No overall enlargement.  Trace ascites.  IMPRESSION: Trace ascites. Heterogeneous liver echotexture without focal mass. No gallstones or signs of acute cholecystitis.   Electronically Signed   By: Rolla Flatten M.D.   On: 10/03/2013 11:34    Anti-infectives: Anti-infectives   None      Assessment/Plan: s/p Procedure(s): RIGHT HEMICOLECTOMY  (Right) To OR today for right hemicolectomy with probable primary anastomosis The surgical procedure has been discussed with the patient.  Potential risks, benefits, alternative treatments, and expected outcomes have been explained.  All of the patient's questions at this time have been answered.  The likelihood of reaching the patient's treatment goal is good.  The patient understand the proposed surgical procedure and wishes to proceed.  Transfuse two more units PRBC this morning  LOS: 1 day    Imogene Burn. Shateria Paternostro 10/04/2013

## 2013-10-04 NOTE — Op Note (Signed)
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 Tryston Gilliam Anesthesia: Gen. Endotracheal Indications: This is a 53 year old male that presents with several days of abdominal distention abdominal pain. He presents to the emergency department for evaluation and a CT scan showed a large mass in the cecum that was obstructing his small bowel. We admitted the patient hospital and transfused 4 units of packed red blood cells for hemoglobin of 6.7. He presents now for urgent right hemicolectomy.  Description of procedure: The patient brought to the operating room and placed in supine position on the operating room table. After an adequate level of general anesthesia was obtained, a Foley catheter was placed under sterile technique. The patient's abdomen was shaved, prepped with ChloraPrep, draped sterile fashion. A timeout was taken to ensure the proper patient proper procedure.  We made a vertical midline incision. Dissection was carried down the fascia which was divided vertically. The into the peritoneal cavity sharply. The patient has significant amount of clear ascites. I palpated his liver and no liver masses were noted. The stomach appeared normal. There is a nasogastric tube in place within the stomach. There is no peritoneal seeding. The patient has a very large hard mass in his right lower quadrant measuring approximately 12 cm across. The small bowel just proximal to this is mildly dilated.  We mobilized the right colon medially by dividing the peritoneum laterally. We bluntly dissected behind the right colon.  Mobilized the hepatic flexure to the mid transverse colon. We divided the mid transverse colon with a GIA 75 stapler. The mesentery was divided with the LigaSure device. We divided the terminal ileum with a GIA-75 stapler. Again we  took the mesentery with the LigaSure device. We will bluntly dissect the duodenum away from the posterior surface of the right colon. The ureter was clearly identified. We completely resected the specimen is sent this for pathologic examination. We inspected the retroperitoneum for hemostasis. I palpated a firm nodule in the upper abdomen appeared to be within the omentum. This was resected and sent separately as a specimen. We irrigated the abdomen thoroughly. I then created a side-to-side ileocolic anastomosis with a GIA-75 stapler and a TA 60 stapler. A crotch suture 3-0 silk was placed. We had inspected the internal staple line prior to closing a TA 60 stapler. There is no bleeding noted here. The mesenteric defect was closed with 2-0 silk sutures after removing all the small bowel to the patient's left side. We thoroughly irrigated the abdomen. Gown and gloves were changed.  New instruments were used to close. The fascia was reapproximated with double-stranded #1 PDS suture. Subcutaneous tissues were irrigated and staples were used to close the skin.All sponge, initially, and needle counts are correct. Honeycomb dressing was placed. The patient was then extended brought to recovery in stable condition.   Imogene Burn. Georgette Dover, MD, [facility]  General/ Trauma Surgery  10/04/2013 12:07 PM

## 2013-10-04 NOTE — Anesthesia Procedure Notes (Signed)
Procedure Name: Intubation Date/Time: 10/04/2013 9:34 AM Performed by: Julian Reil Pre-anesthesia Checklist: Patient identified, Emergency Drugs available, Suction available and Patient being monitored Patient Re-evaluated:Patient Re-evaluated prior to inductionOxygen Delivery Method: Circle system utilized Preoxygenation: Pre-oxygenation with 100% oxygen Intubation Type: IV induction, Cricoid Pressure applied and Rapid sequence Laryngoscope Size: Mac and 4 Grade View: Grade I Tube type: Oral Tube size: 7.5 mm Number of attempts: 1 Airway Equipment and Method: Stylet Placement Confirmation: ETT inserted through vocal cords under direct vision,  positive ETCO2 and breath sounds checked- equal and bilateral Secured at: 22 cm Tube secured with: Tape Dental Injury: Teeth and Oropharynx as per pre-operative assessment

## 2013-10-04 NOTE — Transfer of Care (Signed)
Immediate Anesthesia Transfer of Care Note  Patient: Rick Davenport  Procedure(s) Performed: Procedure(s): RIGHT HEMICOLECTOMY  (Right)  Patient Location: PACU  Anesthesia Type:General  Level of Consciousness: awake, alert , oriented and patient cooperative  Airway & Oxygen Therapy: Patient Spontanous Breathing and Patient connected to face mask oxygen  Post-op Assessment: Report given to PACU RN, Post -op Vital signs reviewed and stable and Patient moving all extremities  Post vital signs: Reviewed and stable  Complications: No apparent anesthesia complications

## 2013-10-04 NOTE — Anesthesia Postprocedure Evaluation (Signed)
  Anesthesia Post-op Note  Patient: Rick Davenport  Procedure(s) Performed: Procedure(s): RIGHT HEMICOLECTOMY  (Right)  Patient Location: PACU  Anesthesia Type:General  Level of Consciousness: awake  Airway and Oxygen Therapy: Patient Spontanous Breathing  Post-op Pain: mild  Post-op Assessment: Post-op Vital signs reviewed  Post-op Vital Signs: Reviewed  Last Vitals:  Filed Vitals:   10/04/13 0908  BP: 127/81  Pulse: 71  Temp: 36.7 C  Resp: 18    Complications: No apparent anesthesia complications

## 2013-10-04 NOTE — Anesthesia Preprocedure Evaluation (Signed)
Anesthesia Evaluation  Patient identified by MRN, date of birth, ID band Patient awake    Reviewed: Allergy & Precautions, H&P , NPO status , Patient's Chart, lab work & pertinent test results  Airway Mallampati: II      Dental   Pulmonary Current Smoker,  breath sounds clear to auscultation        Cardiovascular negative cardio ROS  Rhythm:Regular Rate:Normal     Neuro/Psych    GI/Hepatic negative GI ROS, Neg liver ROS,   Endo/Other  negative endocrine ROS  Renal/GU      Musculoskeletal   Abdominal   Peds  Hematology   Anesthesia Other Findings   Reproductive/Obstetrics                           Anesthesia Physical Anesthesia Plan  ASA: III  Anesthesia Plan: General   Post-op Pain Management:    Induction: Intravenous  Airway Management Planned: Oral ETT  Additional Equipment:   Intra-op Plan:   Post-operative Plan: Possible Post-op intubation/ventilation  Informed Consent: I have reviewed the patients History and Physical, chart, labs and discussed the procedure including the risks, benefits and alternatives for the proposed anesthesia with the patient or authorized representative who has indicated his/her understanding and acceptance.   Dental advisory given  Plan Discussed with: CRNA and Anesthesiologist  Anesthesia Plan Comments:         Anesthesia Quick Evaluation

## 2013-10-05 LAB — CBC
HCT: 29.8 % — ABNORMAL LOW (ref 39.0–52.0)
Hemoglobin: 9.3 g/dL — ABNORMAL LOW (ref 13.0–17.0)
MCH: 20.9 pg — ABNORMAL LOW (ref 26.0–34.0)
MCHC: 31.2 g/dL (ref 30.0–36.0)
MCV: 67 fL — ABNORMAL LOW (ref 78.0–100.0)
PLATELETS: 356 10*3/uL (ref 150–400)
RBC: 4.45 MIL/uL (ref 4.22–5.81)
RDW: 25 % — ABNORMAL HIGH (ref 11.5–15.5)
WBC: 10.6 10*3/uL — AB (ref 4.0–10.5)

## 2013-10-05 LAB — BASIC METABOLIC PANEL
BUN: 9 mg/dL (ref 6–23)
CHLORIDE: 106 meq/L (ref 96–112)
CO2: 22 mEq/L (ref 19–32)
Calcium: 8.2 mg/dL — ABNORMAL LOW (ref 8.4–10.5)
Creatinine, Ser: 0.8 mg/dL (ref 0.50–1.35)
GFR calc non Af Amer: >90 mL/min (ref >90)
Glucose, Bld: 87 mg/dL (ref 70–99)
Potassium: 4.6 mEq/L (ref 3.7–5.3)
Sodium: 137 mEq/L (ref 137–147)

## 2013-10-05 NOTE — Progress Notes (Signed)
No c/o. No flatus. Pain ok. Wants some more ice  Alert, nad cta b/l Soft, nd, approp TTP. Dressing intact. No BS  Plan per PA note  Leighton Ruff. Redmond Pulling, MD, FACS General, Bariatric, & Minimally Invasive Surgery [facility], [place]

## 2013-10-05 NOTE — Progress Notes (Signed)
1 Day Post-Op  Subjective: Pt is up ambulating in room and talking on the phone upon entering the room.  No N/V.  NG tube and PCA in place.  Pain minimal.  Wants tube out.  No flatus or BM yet.  Patient asks very appropriate questions about the surgery.  Objective: Vital signs in last 24 hours: Temp:  [97.4 F (36.3 C)-98.5 F (36.9 C)] 98.5 F (36.9 C) (04/18 0549) Pulse Rate:  [71-103] 72 (04/18 0549) Resp:  [8-18] 16 (04/18 0739) BP: (112-186)/(85-119) 112/85 mmHg (04/18 0549) SpO2:  [96 %-100 %] 100 % (04/18 0739) Last BM Date: 09/30/13  Intake/Output from previous day: 04/17 0701 - 04/18 0700 In: 6275.8 [I.V.:5120.8; Blood:1005; IV Piggyback:150] Out: 1135 [Urine:585; Emesis/NG output:450; Blood:100] Intake/Output this shift:    PE: Gen:  Alert, NAD, pleasant Abd: Soft, minimal tenderness, ND, diminished BS, no HSM, incision intact with honeycomb dressing overtop with serosanguinous drainage Ext:  No erythema, edema, or tenderness  Lab Results:   Recent Labs  10/04/13 1450 10/05/13 0638  WBC 22.5* 10.6*  HGB 11.0* 9.3*  HCT 34.5* 29.8*  PLT 406* 356   BMET  Recent Labs  10/03/13 0900 10/04/13 1450 10/05/13 0638  NA 134*  --  137  K 3.7  --  4.6  CL 94*  --  106  CO2 21  --  22  GLUCOSE 110*  --  87  BUN 13  --  9  CREATININE 0.85 0.87 0.80  CALCIUM 9.1  --  8.2*   PT/INR No results found for this basename: LABPROT, INR,  in the last 72 hours CMP     Component Value Date/Time   NA 137 10/05/2013 0638   K 4.6 10/05/2013 0638   CL 106 10/05/2013 0638   CO2 22 10/05/2013 0638   GLUCOSE 87 10/05/2013 0638   BUN 9 10/05/2013 0638   CREATININE 0.80 10/05/2013 0638   CALCIUM 8.2* 10/05/2013 0638   PROT 6.8 10/03/2013 0900   ALBUMIN 3.1* 10/03/2013 0900   AST 13 10/03/2013 0900   ALT 9 10/03/2013 0900   ALKPHOS 71 10/03/2013 0900   BILITOT 0.3 10/03/2013 0900   GFRNONAA >90 10/05/2013 0638   GFRAA >90 10/05/2013 0638   Lipase     Component Value Date/Time   LIPASE 9* 10/03/2013 0900       Studies/Results: Ct Abdomen Pelvis W Contrast  10/03/2013   ADDENDUM REPORT: 10/03/2013 16:55  ADDENDUM: A moderate 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   Electronically Signed   By: Hassan Rowan M.D.   On: 10/03/2013 16:55   10/03/2013   CLINICAL DATA:  Abdominal pain, vomiting  EXAM: CT ABDOMEN AND PELVIS WITH CONTRAST  TECHNIQUE: Multidetector CT imaging of the abdomen and pelvis was performed using the standard protocol following bolus administration of intravenous contrast.  CONTRAST:  154mL OMNIPAQUE IOHEXOL 300 MG/ML  SOLN  COMPARISON:  None.  FINDINGS: Lung bases are clear.  No pericardial fluid.  No focal hepatic lesion gallbladder, pancreas, spleen, adrenal glands, and kidneys are normal.  Stomach appears normal. There is some contraction through the gastric body which is likely physiologic. There are dilated loops of the small bowel distally. Poor progression of the oral contrast. The distal small bowel is dilated up to 3.3 cm. There is irregular thickening of the cecum involving the cecal tip, terminal ileum, and circumferentially involving the distal cecum over approximately 7.5 cm  segment. The appendix is identified and is normal caliber. There several small lymph nodes along the pericolic gutter inferior to the cecal tip (image 57, series 2 for example lymph node measures 7 mm). There is a large a rounded lymph node in the ileocecal mesentery measuring 10 mm (image 46). The more distal ascending ascending, transverse, and descending colon are normal. There is minimal stool in the distal colon.  Abdominal or is normal caliber. No retroperitoneal lymphadenopathy. No periportal lymphadenopathy.  No free fluid in the pelvis. Edge of free fluid in the pelvis and small mildly the mesentery.  The prostate gland is normal.  No pelvic lymphadenopathy.  IMPRESSION: 1.  Irregular masslike thickening of the distal cecum is most consistent colorectal carcinoma. 2. Distal small bowel obstruction with obstruction at level of the ileocecal valve. 3. 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 4. Smaller free fluid within the abdomen pelvis related to the bowel obstruction. 5. Contraction through the gastric body is likely physiologic nondistention. Findings conveyed toMARISSA SCIACCA on 10/03/2013  at12:27.  Electronically Signed: By: Suzy Bouchard M.D. On: 10/03/2013 12:28   US Abdomen Limited Ruq  10/03/2013   CLINICAL DATA:  Right upper quadrant pain.  EXAM: US ABDOMEN LIMITED - RIGHT UPPER QUADRANT  COMPARISON:  None.  FINDINGS: Gallbladder:  No gallstones or wall thickening visualized. No sonographic Murphy sign noted.  Common bile duct:  Diameter: Normal measuring 1.3 mm  Liver:  Heterogeneous echotexture without focal mass. No overall enlargement.  Trace ascites.  IMPRESSION: Trace ascites. Heterogeneous liver echotexture without focal mass. No gallstones or signs of acute cholecystitis.   Electronically Signed   By: Rolla Flatten M.D.   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  Await bowel function prior to advancing diet 2.  IVF, pain control, antiemetics, antibiotics 3.  Await pathology report 4.  Ambulate and IS 5.  SCD's and lovenox (monitor Hgb), repeat labs tomorrow 6.  Keep PCA today, plan to d/c tomorrow 7.  Discussed the findings of the surgery and the high likelihood of his tumor being malignant, he is appropriately concerned and ask  many appropriate questions.  He is thankful for our care.     LOS: 2 days    Coralie Keens 10/05/2013, 9:43 AM Pager: [phone]

## 2013-10-06 LAB — CBC
HEMATOCRIT: 27.2 % — AB (ref 39.0–52.0)
HEMOGLOBIN: 8.3 g/dL — AB (ref 13.0–17.0)
MCH: 20.5 pg — ABNORMAL LOW (ref 26.0–34.0)
MCHC: 30.5 g/dL (ref 30.0–36.0)
MCV: 67.3 fL — ABNORMAL LOW (ref 78.0–100.0)
Platelets: 344 10*3/uL (ref 150–400)
RBC: 4.04 MIL/uL — ABNORMAL LOW (ref 4.22–5.81)
RDW: 25.7 % — AB (ref 11.5–15.5)
WBC: 8.7 10*3/uL (ref 4.0–10.5)

## 2013-10-06 LAB — BASIC METABOLIC PANEL
BUN: 7 mg/dL (ref 6–23)
CHLORIDE: 104 meq/L (ref 96–112)
CO2: 20 mEq/L (ref 19–32)
Calcium: 8.4 mg/dL (ref 8.4–10.5)
Creatinine, Ser: 0.72 mg/dL (ref 0.50–1.35)
GFR calc non Af Amer: >90 mL/min (ref >90)
GLUCOSE: 70 mg/dL (ref 70–99)
POTASSIUM: 4.1 meq/L (ref 3.7–5.3)
Sodium: 138 mEq/L (ref 137–147)

## 2013-10-06 MED ORDER — HYDROCODONE-ACETAMINOPHEN 5-325 MG PO TABS
1.0000 | ORAL_TABLET | ORAL | Status: DC | PRN
  Administered 2013-10-07 – 2013-10-08 (×2): 2 via ORAL
  Filled 2013-10-06 (×2): qty 2

## 2013-10-06 MED ORDER — MORPHINE SULFATE 2 MG/ML IJ SOLN
1.0000 mg | INTRAMUSCULAR | Status: DC | PRN
  Administered 2013-10-06: 2 mg via INTRAVENOUS
  Filled 2013-10-06: qty 1

## 2013-10-06 NOTE — Progress Notes (Signed)
Sitting in chair; +flatus. NG clamped No c/o  cta  Reg Soft, nd, incision dressing c/d/i. HypoBS, not really tender  Ng tube output was clear  Ok with clamping trial and removal Ambulate Sips of clears  [employer]. Redmond Pulling, MD, FACS General, Bariatric, & Minimally Invasive Surgery [facility], [place]

## 2013-10-06 NOTE — Progress Notes (Signed)
2 Days Post-Op  Subjective: Pt doing well, minimal pain.  No N/V, NG with 564mL/24 hours.  Since changed this am only 246mL and completely clear.  Ambulating in the room.  Some flatus, no BM yet.  Wants to try liquids.  Wants NG and PCA gone.  Wants to ambulate in the halls.  Objective: Vital signs in last 24 hours: Temp:  [98 F (36.7 C)-98.6 F (37 C)] 98.2 F (36.8 C) (04/19 0517) Pulse Rate:  [68-90] 76 (04/19 0517) Resp:  [10-17] 12 (04/19 0738) BP: (116-144)/(75-85) 125/79 mmHg (04/19 0517) SpO2:  [94 %-100 %] 100 % (04/19 0738) Last BM Date: 09/30/13  Intake/Output from previous day: 04/18 0701 - 04/19 0700 In: 1995 [P.O.:120; I.V.:1875] Out: 1900 [Urine:1300; Emesis/NG output:600] Intake/Output this shift:    PE: Gen:  Alert, NAD, pleasant Abd: Soft, mild tenderness, ND, +BS, no HSM, incision with sanguinous drainage on midline honeycomb dressing   Lab Results:   Recent Labs  10/04/13 1450 10/05/13 0638  WBC 22.5* 10.6*  HGB 11.0* 9.3*  HCT 34.5* 29.8*  PLT 406* 356   BMET  Recent Labs  10/03/13 0900 10/04/13 1450 10/05/13 0638  NA 134*  --  137  K 3.7  --  4.6  CL 94*  --  106  CO2 21  --  22  GLUCOSE 110*  --  87  BUN 13  --  9  CREATININE 0.85 0.87 0.80  CALCIUM 9.1  --  8.2*   PT/INR No results found for this basename: LABPROT, INR,  in the last 72 hours CMP     Component Value Date/Time   NA 137 10/05/2013 0638   K 4.6 10/05/2013 0638   CL 106 10/05/2013 0638   CO2 22 10/05/2013 0638   GLUCOSE 87 10/05/2013 0638   BUN 9 10/05/2013 0638   CREATININE 0.80 10/05/2013 0638   CALCIUM 8.2* 10/05/2013 0638   PROT 6.8 10/03/2013 0900   ALBUMIN 3.1* 10/03/2013 0900   AST 13 10/03/2013 0900   ALT 9 10/03/2013 0900   ALKPHOS 71 10/03/2013 0900   BILITOT 0.3 10/03/2013 0900   GFRNONAA >90 10/05/2013 0638   GFRAA >90 10/05/2013 0638   Lipase     Component Value Date/Time   LIPASE 9* 10/03/2013 0900       Studies/Results: No results  found.  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 Bowel function starting, clamping trials for 6 hours and if tolerates then can d/c ng tube and start clears.  Okay to have sips of clears during clamping trial. 2. IVF, pain control, antiemetics, antibiotics  3. Await pathology report  4. Ambulate in halls and IS  5. SCD's and lovenox (monitor Hgb), pending labs 6. D/c pca today, start IV morphine push, orals when taking PO 7. Patient is in good spirits today despite discussion yesterday about high likelihood of cancer.    LOS: 3 days    Coralie Keens 10/06/2013, 8:01 AM Pager: [phone]

## 2013-10-06 NOTE — Progress Notes (Addendum)
Unable to check residual on NG. Patient had taken ng out at 3pm, end of clamping trial. Patient stated he was sorry didn't understand the nurse needed to check "something". Stated he is fine, no nausea or vomiting at all. MD notified.

## 2013-10-07 ENCOUNTER — Encounter (HOSPITAL_COMMUNITY): Payer: Self-pay | Admitting: Surgery

## 2013-10-07 LAB — TYPE AND SCREEN
ABO/RH(D): O POS
Antibody Screen: NEGATIVE
UNIT DIVISION: 0
UNIT DIVISION: 0
Unit division: 0
Unit division: 0
Unit division: 0
Unit division: 0

## 2013-10-07 LAB — CBC
HCT: 26.5 % — ABNORMAL LOW (ref 39.0–52.0)
HEMOGLOBIN: 8.2 g/dL — AB (ref 13.0–17.0)
MCH: 20.7 pg — ABNORMAL LOW (ref 26.0–34.0)
MCHC: 30.9 g/dL (ref 30.0–36.0)
MCV: 66.9 fL — ABNORMAL LOW (ref 78.0–100.0)
Platelets: 350 10*3/uL (ref 150–400)
RBC: 3.96 MIL/uL — ABNORMAL LOW (ref 4.22–5.81)
RDW: 26.5 % — ABNORMAL HIGH (ref 11.5–15.5)
WBC: 7.6 10*3/uL (ref 4.0–10.5)

## 2013-10-07 LAB — POCT I-STAT 4, (NA,K, GLUC, HGB,HCT)
Glucose, Bld: 99 mg/dL (ref 70–99)
HEMATOCRIT: 36 % — AB (ref 39.0–52.0)
HEMOGLOBIN: 12.2 g/dL — AB (ref 13.0–17.0)
POTASSIUM: 4 meq/L (ref 3.7–5.3)
Sodium: 136 mEq/L — ABNORMAL LOW (ref 137–147)

## 2013-10-07 MED ORDER — MORPHINE SULFATE 2 MG/ML IJ SOLN: 1.0000 mg | INTRAMUSCULAR | Status: DC | PRN

## 2013-10-07 NOTE — Progress Notes (Signed)
I have seen and examined the patient and agree with the assessment and plans.  Prestyn Mahn A. Trevonte Ashkar  MD, FACS  

## 2013-10-07 NOTE — Progress Notes (Signed)
3 Days Post-Op  Subjective: Pt doing great.  He pulled out his NG tube last night at 3pm and wasn't able to get residuals checked, but we started him on clears.  He tolerated those well and had a BM this am.  He's having good flatus.  Ambulated 3 laps yesterday.  Minimal pain, not asking for pain meds.  Urinating well.  Anxious to find out pathology results.  Objective: Vital signs in last 24 hours: Temp:  [97.8 F (36.6 C)-98.3 F (36.8 C)] 97.8 F (36.6 C) (04/20 0543) Pulse Rate:  [64-77] 69 (04/20 0543) Resp:  [14-17] 17 (04/20 0543) BP: (103-140)/(61-93) 139/93 mmHg (04/20 0543) SpO2:  [99 %-100 %] 100 % (04/20 0543) Last BM Date: 09/30/13  Intake/Output from previous day: 04/19 0701 - 04/20 0700 In: 342 [P.O.:342] Out: 2150 [Urine:2150] Intake/Output this shift:    PE: Gen:  Alert, NAD, pleasant Abd: Soft, NT/ND, +BS, no HSM, incision CDI after removing honeycomb dressing  Lab Results:   Recent Labs  10/06/13 0920 10/07/13 0345  WBC 8.7 7.6  HGB 8.3* 8.2*  HCT 27.2* 26.5*  PLT 344 350   BMET  Recent Labs  10/05/13 0638 10/06/13 0920  NA 137 138  K 4.6 4.1  CL 106 104  CO2 22 20  GLUCOSE 87 70  BUN 9 7  CREATININE 0.80 0.72  CALCIUM 8.2* 8.4   PT/INR No results found for this basename: LABPROT, INR,  in the last 72 hours CMP     Component Value Date/Time   NA 138 10/06/2013 0920   K 4.1 10/06/2013 0920   CL 104 10/06/2013 0920   CO2 20 10/06/2013 0920   GLUCOSE 70 10/06/2013 0920   BUN 7 10/06/2013 0920   CREATININE 0.72 10/06/2013 0920   CALCIUM 8.4 10/06/2013 0920   PROT 6.8 10/03/2013 0900   ALBUMIN 3.1* 10/03/2013 0900   AST 13 10/03/2013 0900   ALT 9 10/03/2013 0900   ALKPHOS 71 10/03/2013 0900   BILITOT 0.3 10/03/2013 0900   GFRNONAA >90 10/06/2013 0920   GFRAA >90 10/06/2013 0920   Lipase     Component Value Date/Time   LIPASE 9* 10/03/2013 0900       Studies/Results: No results found.  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 NG tube removed, yesterday, and tolerating clears, had a BM, advance to fulls this am and soft at dinner if tolerating 2. Red IVF  3. Await pathology report  4. Ambulate in halls and IS  5. SCD's and lovenox (monitor Hgb), repeat labs tomorrow 6. Encourage orals for pain 7. Patient is in good spirits today despite discussion yesterday about high likelihood of cancer. 8. Possible d/c tomorrow if tolerating soft diet    LOS: 4 days    Coralie Keens 10/07/2013, 7:52 AM Pager: [phone]

## 2013-10-08 ENCOUNTER — Other Ambulatory Visit: Payer: Self-pay | Admitting: *Deleted

## 2013-10-08 ENCOUNTER — Encounter (HOSPITAL_COMMUNITY): Payer: Self-pay | Admitting: General Surgery

## 2013-10-08 ENCOUNTER — Other Ambulatory Visit (INDEPENDENT_AMBULATORY_CARE_PROVIDER_SITE_OTHER): Payer: Self-pay | Admitting: Surgery

## 2013-10-08 DIAGNOSIS — C180 Malignant neoplasm of cecum: Secondary | ICD-10-CM | POA: Diagnosis present

## 2013-10-08 DIAGNOSIS — R634 Abnormal weight loss: Secondary | ICD-10-CM | POA: Diagnosis present

## 2013-10-08 DIAGNOSIS — F17200 Nicotine dependence, unspecified, uncomplicated: Secondary | ICD-10-CM | POA: Diagnosis present

## 2013-10-08 DIAGNOSIS — I739 Peripheral vascular disease, unspecified: Secondary | ICD-10-CM | POA: Diagnosis present

## 2013-10-08 DIAGNOSIS — I771 Stricture of artery: Secondary | ICD-10-CM

## 2013-10-08 DIAGNOSIS — I745 Embolism and thrombosis of iliac artery: Secondary | ICD-10-CM

## 2013-10-08 DIAGNOSIS — C189 Malignant neoplasm of colon, unspecified: Secondary | ICD-10-CM

## 2013-10-08 DIAGNOSIS — C786 Secondary malignant neoplasm of retroperitoneum and peritoneum: Secondary | ICD-10-CM | POA: Diagnosis present

## 2013-10-08 LAB — CBC
HCT: 26.5 % — ABNORMAL LOW (ref 39.0–52.0)
Hemoglobin: 8.3 g/dL — ABNORMAL LOW (ref 13.0–17.0)
MCH: 20.6 pg — ABNORMAL LOW (ref 26.0–34.0)
MCHC: 31.3 g/dL (ref 30.0–36.0)
MCV: 65.9 fL — ABNORMAL LOW (ref 78.0–100.0)
Platelets: 385 10*3/uL (ref 150–400)
RBC: 4.02 MIL/uL — ABNORMAL LOW (ref 4.22–5.81)
RDW: 26.8 % — AB (ref 11.5–15.5)
WBC: 8.4 10*3/uL (ref 4.0–10.5)

## 2013-10-08 MED ORDER — HYDROCODONE-ACETAMINOPHEN 5-325 MG PO TABS: 1.0000 | ORAL_TABLET | Freq: Four times a day (QID) | ORAL | Status: DC | PRN

## 2013-10-08 NOTE — Care Management Note (Signed)
  Page 1 of 1   10/08/2013     11:03:16 AM CARE MANAGEMENT NOTE 10/08/2013  Patient:  [facility]   Account Number:  [id number]  Date Initiated:  10/08/2013  Documentation initiated by:  Magdalen Spatz  Subjective/Objective Assessment:     Action/Plan:   Anticipated DC Date:     Anticipated DC Plan:  HOME/SELF CARE         Choice offered to / List presented to:             Status of service:   Medicare Important Message given?   (If response is "NO", the following Medicare IM given date fields will be blank) Date Medicare IM given:   Date Additional Medicare IM given:    Discharge Disposition:    Per UR Regulation:    If discussed at Long Length of Stay Meetings, dates discussed:   10/08/2013    Comments:  Son Louie Casa [phone]   10-08-13 [place] letter given and explained to patient ( over ride for Vicodin entered ) .  Cytogeneticist Melvin [phone]. She will call patient directly to set up appointments to apply for Medicaid and disability .  Gave patient information on [facility] .  Magdalen Spatz RN BSN [phone]

## 2013-10-08 NOTE — Consult Note (Signed)
[facility]  Reason for Consult:  Thrombus in iliac artery Referring Physician:  General Surgery MRN #:  [id number]  History of Present Illness: This is a 53 y.o. male who presents to the hospital with abdominal pain for about 4 months.  This acutely worsened.  Ct scan revealed a distal cecal mass with distal small bowel obstruction.  He underwent exploratory laparotomy with right hemicolectomy on 10/04/13.   He was doing well and about to be discharged.  An addendum on the CT  Scan revealed a moderate amount of thrombus/eccentric atherosclerotic plaque is present within the right external iliac artery with distal vessels patent.  [facility] is consulted.  He does smoke ~ 0.25ppd. States he has only had one episode of cramping in his right leg last week and this was the only episode.  History reviewed. No pertinent past medical history. Past Surgical History  Procedure Laterality Date  . Partial colectomy Right 10/04/2013    Procedure: RIGHT HEMICOLECTOMY ;  Surgeon: Imogene Burn. Georgette Dover, MD;  Location: [facility];  Service: General;  Laterality: Right;    No Known Allergies  Prior to Admission medications   Medication Sig Start Date End Date Taking? Authorizing Provider  HYDROcodone-acetaminophen (NORCO/VICODIN) 5-325 MG per tablet Take 1-2 tablets by mouth every 6 (six) hours as needed for moderate pain or severe pain. 10/08/13   Coralie Keens, PA-C    History   Social History  . Marital Status: Single    Spouse Name: N/A    Number of Children: N/A  . Years of Education: N/A   Occupational History  . Not on file.   Social History Main Topics  . Smoking status: Current Every Day Smoker -- 1.00 packs/day    Types: Cigarettes  . Smokeless tobacco: Not on file  . Alcohol Use: 0.6 oz/week    1 Cans of beer per week     Comment: 1 can of beer per day  . Drug Use: No  . Sexual Activity: Not on file   Other Topics Concern  . Not on file   Social History Narrative   . No narrative on file     Family History  Problem Relation Age of Onset  . Diabetes Mother   . Lung cancer Father   . Prostate cancer Brother     Oldest brother    ROS: [x]  Positive   [ ]  Negative   [ ]  All sytems reviewed and are negative  Cardiovascular: []  chest pain/pressure []  palpitations []  SOB lying flat []  DOE []  pain in legs while walking []  pain in legs at rest []  pain in legs at night []  non-healing ulcers []  hx of DVT []  swelling in legs  Pulmonary: []  productive cough []  asthma/wheezing []  home O2  Neurologic: []  weakness in []  arms []  legs []  numbness in []  arms []  legs []  hx of CVA []  mini stroke [] difficulty speaking or slurred speech []  temporary loss of vision in one eye []  dizziness  Hematologic: [x]  hx of cancer-colon s/p right hemicolectomy []  bleeding problems []  problems with blood clotting easily  Endocrine:   []  diabetes []  thyroid disease  GI []  vomiting blood []  blood in stool  GU: []  CKD/renal failure []  HD--[]  M/W/F or []  T/T/S []  burning with urination []  blood in urine  Psychiatric: []  anxiety []  depression  Musculoskeletal: []  arthritis []  joint pain  Integumentary: []  rashes []  ulcers  Constitutional: []  fever []  chills [x]  30lb weight loss over  6 months   Physical Examination  Filed Vitals:   10/08/13 0449  BP: 141/89  Pulse: 73  Temp: 98.3 F (36.8 C)  Resp: 13   Body mass index is 20.79 kg/(m^2).  General:  WDWN in NAD Gait: Not observed HENT: WNL, normocephalic Pulmonary: normal non-labored breathing, without Rales, rhonchi,  wheezing Cardiac: regular, without  Murmurs, rubs or gallops; Abdomen: soft, NT/ND, no masses Skin: without rashes, without ulcers  Vascular Exam/Pulse Femoral 2+ (normal) 2+ (normal)  DP 2+ (normal) 2+ (normal  PT 2+ (normal) 2+ (normal)   Extremities: without ischemic changes, without Gangrene , without cellulitis; without open wounds;  Musculoskeletal: no  muscle wasting or atrophy  Neurologic: A&O X 3; Appropriate Affect ; SENSATION: normal; MOTOR FUNCTION:  moving all extremities equally. Speech is fluent/normal Psychiatric:  Normal affect Lymph:  No inguinal lymphandenopathy   CBC    Component Value Date/Time   WBC 8.4 10/08/2013 0450   RBC 4.02* 10/08/2013 0450   HGB 8.3* 10/08/2013 0450   HCT 26.5* 10/08/2013 0450   PLT 385 10/08/2013 0450   MCV 65.9* 10/08/2013 0450   MCH 20.6* 10/08/2013 0450   MCHC 31.3 10/08/2013 0450   RDW 26.8* 10/08/2013 0450   LYMPHSABS 1.5 10/03/2013 0900   MONOABS 0.8 10/03/2013 0900   EOSABS 0.0 10/03/2013 0900   BASOSABS 0.0 10/03/2013 0900    BMET    Component Value Date/Time   NA 138 10/06/2013 0920   K 4.1 10/06/2013 0920   CL 104 10/06/2013 0920   CO2 20 10/06/2013 0920   GLUCOSE 70 10/06/2013 0920   BUN 7 10/06/2013 0920   CREATININE 0.72 10/06/2013 0920   CALCIUM 8.4 10/06/2013 0920   GFRNONAA >90 10/06/2013 0920   GFRAA >90 10/06/2013 0920    COAGS: No results found for this basename: INR, PROTIME     Non-Invasive Vascular Imaging:   CT Scan 10/03/13:  A moderate 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   Statin:  no Beta Blocker:  no Aspirin:  no ACEI:  no ARB:  no Other antiplatelets/anticoagulants:  no   ASSESSMENT: This is a 53 y.o. male who is s/p right hemicolectomy with incidental finding of thrombus/eccentric atherosclerotic plaque within the right iliac artery.  PLAN: -pt is asymptomatic and does have palpable pedal pulses bilaterally.  We will have him f/u in our office with Dr. Oneida Alar in one month with exercise ABI's.   Leontine Locket, PA-C [facility] [phone]

## 2013-10-08 NOTE — Consult Note (Signed)
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  History of smoking.  Recently has had onset of claudication right calf after walking a few blocks.  No rest pain.  Has palpable pedal pulses bilateral lower extremities. History and exam otherwise as above.  With recent laparotomy for colon cancer and minimal symptoms will see as outpt in one month.  Will obtain ABI with exercise at office visit.  Greater than 3 minutes spent regarding smoking cessation today.  He will also walk 30 minutes daily to improve collaterals  Ruta Hinds, MD [facility] [place]: [phone] Pager: [phone]

## 2013-10-08 NOTE — Discharge Summary (Signed)
Physician Discharge Summary  Patient ID: Rick Davenport MRN: [id number] DOB/AGE: 25-Jun-1960 53 y.o.  Admit date: 10/03/2013 Discharge date: 10/08/2013  Admitting Diagnosis: Obstructing colon mass Lymphadenopathy Weight loss Tobacco abuse ABL Anemia  Discharge Diagnosis Patient Active Problem List   Diagnosis Date Noted  . Adenocarcinoma of cecum 10/08/2013  . Omental metastasis 10/08/2013  . Tobacco use disorder 10/08/2013  . Loss of weight 10/08/2013  . Claudication of right lower extremity 10/08/2013  . Colon cancer 10/03/2013  . Anemia 10/03/2013    Consultants Dr. Benson Norway - Gastroenterology  Imaging: No results found.  Procedures Dr. Georgette Dover (10/04/13) - Exploratory laparotomy with right hemicolectomy   Hospital Course:  53 year old healthy African American male who presents to [facility] with abdominal pain. Duration of symptoms is 4 months. Onset of symptoms was in December at which time he developed vomiting and abdominal pain. He reports being given over the counter pills by his son which helped his symptoms until about 2 weeks ago at which point he ran out. He is unsure of exactly what he was taking, stating "they looked like little stars." Over the past 2 weeks his symptoms have persisted. On Monday, he took a laxative and had some diarrhea, but did not help with the pain. This morning, the pain acutely worsened. Location is generalized abdomen. Time pattern is constant. Moderate in severity. Not associated with nausea or vomiting at present time. He reports frequent hiccups. No modifying factors. Aggravated by movement and lying in supine position. Alleviating factors; fetal position.   He reports he typically has a bowel movement 2 times per week without laxatives. He denies change in caliber stool, melena or hematochezia. He reports approximately 30lb weight loss over the last 6 months. He appetite has been adequate. He denies a family history of colon cancer. Father had lung  cancer and oldest brother had prostate cancer. He has 4 children who are healthy. He is a daily smoker, 1 pack per week and drinks 1 can of beer daily when he has the funds. He denies illicit drug use. He has never had a colonoscopy.   Ct of abdomen and pelvis showed distal cecal thickening consistent with a mass with local nodal metastasis in the ileal cecal mesentery in addition to a distal small bowel obstruction. Hemoglobin and hematocrit of 6.7/23.2 with a negative FOBT. Colon cancer was in high suspecion.  We consulted Dr. Benson Norway from GI who recommended proceeding with surgical intervention because he did not see that a colonoscopy would be feasible given the high grade obstruction and inability to prep.  Patient was admitted and subsequently underwent procedure listed above.  Tolerated procedure well and was transferred to the floor.  He was found during surgery to have a large cecal colon mass which was thought to be cancerous due to appearance.  He also had many enlarged lymph nodes and a smaller mesenteric mass which was biopsied.  His NG tube was clamped on POD #2 and was removed (by the patient) after he tolerated clamaping trials.  Diet was advanced as tolerated.  On POD #3 he started having BM's and was tolerating full liquids.  The patient's pathology report came back as metastatic adenocarcinoma with 8/17 lymph nodes positive and an omental lymph node positive for tumor as well.  His CEA level pre-surgery was normal at <0.5ng/mL. He also had an addendum to his CT scan showed a moderate amount of thrombus/eccentric atherosclerotic plaque is present within the right external iliac artery with distal vessels  patent.  The patient notes some right sided claudication, but without rest pain.  He was seen by Dr. Oneida Alar from vascular surgery who thinks its most likely an atheroma.  He recommended OP follow-up in 1 month to obtain ABI with exercise.  He's recommended to quit smoking and walk 30 minutes a day  to improve collateral blood flow.   On POD #4, the patient was voiding well, tolerating a soft diet, ambulating well, pain well controlled, vital signs stable, incisions c/d/i and felt stable for discharge home.  His older brother Earlie Server and his son Mikel Cella have been notified of his diagnosis and are willing to help in his post-operative care.  His son lives locally and the rest of his family lives in [place].  Patient will follow up in our office in 1 weeks with Dr. Georgette Dover and knows to call with questions or concerns.  He will have his staples out on Monday and start the referral process for oncology, and additional staging workup.    CT ABDOMEN AND PELVIS WITH CONTRAST  FINDINGS:  Lung bases are clear. No pericardial fluid.  No focal hepatic lesion gallbladder, pancreas, spleen, adrenal  glands, and kidneys are normal.  Stomach appears normal. There is some contraction through the  gastric body which is likely physiologic. There are dilated loops of  the small bowel distally. Poor progression of the oral contrast. The  distal small bowel is dilated up to 3.3 cm. There is irregular  thickening of the cecum involving the cecal tip, terminal ileum, and  circumferentially involving the distal cecum over approximately 7.5  cm segment. The appendix is identified and is normal caliber. There  several small lymph nodes along the pericolic gutter inferior to the  cecal tip (image 57, series 2 for example lymph node measures 7 mm).  There is a large a rounded lymph node in the ileocecal mesentery  measuring 10 mm (image 46). The more distal ascending ascending,  transverse, and descending colon are normal. There is minimal stool  in the distal colon.  Abdominal or is normal caliber. No retroperitoneal lymphadenopathy.  No periportal lymphadenopathy.  No free fluid in the pelvis. Edge of free fluid in the pelvis and  small mildly the mesentery.  The prostate gland is normal. No pelvic  lymphadenopathy.  IMPRESSION:  1. Irregular masslike thickening of the distal cecum is most  consistent colorectal carcinoma.  2. Distal small bowel obstruction with obstruction at level of the  ileocecal valve.  3. 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  4. Smaller free fluid within the abdomen pelvis related to the bowel  obstruction.  5. Contraction through the gastric body is likely physiologic  nondistention. ADDENDUM REPORT: 10/03/2013 16:55  A moderate amount of thrombus/ eccentric atherosclerotic plaque is  present within the right external iliac artery (64-65) with distal  vessels patent.   Surgical pathology:  Diagnosis  1. Omentum, resection for tumor, nodule  -ONE LYMPH NODE, POSITIVE FOR TUMOR (1/1), SEE COMMENT.  2. Colon, segmental resection for tumor, right colon  - INVASIVE MODERATELY TO POORLY DIFFERENTIATED ADENOCARCINOMA, SEE COMMENT.  - TUMOR EXTENDS INTO PERICOLONIC SOFT TISSUE WITH FOCAL INVOLVEMENT OF VISCERAL PERITONEUM.  - LYMPH VASCULAR INVASION IDENTIFIED.  - EIGHT LYMPH NODES, POSITIVE FOR TUMOR (8/17).  - BENIGN APPENDIX WITH FIBROUS OBLITERATION; NEGATIVE FOR ATYPIA OR MALIGNANCY.  - SURGICAL MARGINS, NEGATIVE FOR TUMOR.  - SEE TUMOR SYNOPTIC TEMPLATE BELOW.    Physical Exam: General:  Alert, NAD, pleasant, comfortable  HEENT: head is normocephalic, atraumatic.  Sclera are noninjected.   Ears and nose without any masses or lesions.  Mouth is pink and moist Heart: regular, rate, and rhythm.  Normal s1,s2. No obvious murmurs, gallops, or rubs noted.  Palpable radial and DP pulses bilaterally Lungs: CTAB, no wheezes, rhonchi, or rales noted.  Respiratory effort nonlabored Abd:  Soft, ND, mild tenderness, incisions C/D/I with staples in place MS: all 4 extremities are symmetrical with no cyanosis, clubbing, or edema. Skin: warm and dry with no masses, lesions, or rashes Psych: A&Ox3 with an appropriate affect.     Medication List          HYDROcodone-acetaminophen 5-325 MG per tablet  Commonly known as:  NORCO/VICODIN  Take 1-2 tablets by mouth every 6 (six) hours as needed for moderate pain or severe pain.         Follow-up Information   Follow up with Maia Petties., MD On 10/14/2013. (For post-operation check.  Your appointment is at 8:50am with Dr. Georgette Dover, please arrive by 8:30am to check in and fill out paperwork.)    Specialty:  General Surgery   Contact information:   [place] [address] [phone]       Follow up with [facility]    . (This is a primary care provider who offers low or no cost primary care.  Call and see about getting extablished with a doctor.  )    Contact information:   [address] [place] [address] [phone]      Follow up with Elam Dutch, MD In 1 month. (Office will call you to arrange your appt (sent))    Specialty:  Vascular Surgery   Contact information:   [address] [place] [address] [phone]       Signed: Coralie Keens [facility] [phone]  10/08/2013, 3:18 PM

## 2013-10-08 NOTE — Discharge Instructions (Signed)
[facility]      [facility] °[phone] ° °OPEN ABDOMINAL SURGERY: POST OP INSTRUCTIONS ° °Always review your discharge instruction sheet given to you by the facility where your surgery was performed. ° °IF YOU HAVE DISABILITY OR FAMILY LEAVE FORMS, YOU MUST BRING THEM TO THE OFFICE FOR PROCESSING.  PLEASE DO NOT GIVE THEM TO YOUR DOCTOR. ° °1. A prescription for pain medication may be given to you upon discharge.  Take your pain medication as prescribed, if needed.  If narcotic pain medicine is not needed, then you may take acetaminophen (Tylenol) or ibuprofen (Advil) as needed. °2. Take your usually prescribed medications unless otherwise directed. °3. If you need a refill on your pain medication, please contact your pharmacy. They will contact our office to request authorization.  Prescriptions will not be filled after 5pm or on week-ends. °4. You should follow a light diet the first few days after arrival home, such as soup and crackers, pudding, etc.unless your doctor has advised otherwise. A high-fiber, low fat diet can be resumed as tolerated.   Be sure to include lots of fluids daily. Most patients will experience some swelling and bruising on the chest and neck area.  Ice packs will help.  Swelling and bruising can take several days to resolve °5. Most patients will experience some swelling and bruising in the area of the incision. Ice pack will help. Swelling and bruising can take several days to resolve..  °6. It is common to experience some constipation if taking pain medication after surgery.  Increasing fluid intake and taking a stool softener will usually help or prevent this problem from occurring.  A mild laxative (Milk of Magnesia or Miralax) should be taken according to package directions if there are no bowel movements after 48 hours. °7.  You may have steri-strips (small skin tapes) in place directly over the incision.  These strips should be left on the skin for 7-10 days.  If your  surgeon used skin glue on the incision, you may shower in 24 hours.  The glue will flake off over the next 2-3 weeks.  Any sutures or staples will be removed at the office during your follow-up visit. You may find that a light gauze bandage over your incision may keep your staples from being rubbed or pulled. You may shower and replace the bandage daily. °8. ACTIVITIES:  You may resume regular (light) daily activities beginning the next day--such as daily self-care, walking, climbing stairs--gradually increasing activities as tolerated.  You may have sexual intercourse when it is comfortable.  Refrain from any heavy lifting or straining until approved by your doctor. °a. You may drive when you no longer are taking prescription pain medication, you can comfortably wear a seatbelt, and you can safely maneuver your car and apply brakes °b. Return to Work: ___________________________________ °9. You should see your doctor in the office for a follow-up appointment approximately two weeks after your surgery.  Make sure that you call for this appointment within a day or two after you arrive home to insure a convenient appointment time. °OTHER INSTRUCTIONS:  °_____________________________________________________________ °_____________________________________________________________ ° °WHEN TO CALL YOUR DOCTOR: °1. Fever over 101.0 °2. Inability to urinate °3. Nausea and/or vomiting °4. Extreme swelling or bruising °5. Continued bleeding from incision. °6. Increased pain, redness, or drainage from the incision. °7. Difficulty swallowing or breathing °8. Muscle cramping or spasms. °9. Numbness or tingling in hands or feet or around lips. ° °The clinic staff is available to   answer your questions during regular business hours.  Please dont hesitate to call and ask to speak to one of the nurses if you have concerns.  For further questions, please visit [email or web address]   Colorectal Cancer Colorectal cancer  is an abnormal growth of tissue (tumor) in the colon or rectum that is cancerous (malignant). Unlike noncancerous (benign) tumors, malignant tumors can spread to other parts of your body. The colon is the large bowel or large intestine. The rectum is the last several inches of the colon.  RISK FACTORS The exact cause of colorectal cancer is unknown. However, the following factors may increase your chances of getting colorectal cancer:   Age older than 15 years.   Abnormal growths (polyps) on the inner wall of the colon or rectum.   Diabetes.   African American race.   Family history of hereditary nonpolyposis colorectal cancer. This condition is caused by changes in the genes that are responsible for repairing mismatched DNA.   Personal history of cancer. A person who has already had colorectal cancer may develop it a second time. Also, women with a history of ovarian, uterine, or breast cancer are at a somewhat higher risk of developing colorectal cancer.  Certain hereditary conditions.  Eating a diet that is high in fat (especially animal fat) and low in fiber, fruits, and vegetables.  Sedentary lifestyle.  Inflammatory bowel disease, including ulcerative colitis and Crohn disease.   Smoking.   Excessive alcohol use.  SYMPTOMS Early colorectal cancer often does not cause symptoms. As the cancer grows, symptoms may include:   Changes in bowel habits.  Diarrhea.   Constipation.   Feeling like the bowel does not empty completely after a bowel movement.   Blood in the stool.   Stools that are narrower than usual.   Abdominal discomfort, pain, bloating, fullness, or cramps.  Frequent gas pain.   Unexplained weight loss.   Constant tiredness.   Nausea and vomiting.  DIAGNOSIS  Your health care provider will ask about your medical history. He or she may also perform a number of procedures, such as:   A physical exam.  A digital rectal exam.  A  fecal occult blood test.  A barium enema.  Blood tests.   X-rays.   Imaging tests, such as CT scans or MRIs.   Taking a tissue sample (biopsy) from your colon or rectum to look for cancer cells.   A sigmoidoscopy to view the inside of the last part of your colon.   A colonoscopy to view the inside of your entire colon.   An endorectal ultrasound to see how deep a rectal tumor has grown and whether the cancer has spread to lymph nodes or other nearby tissues.  Your cancer will be staged to determine its severity and extent. Staging is a careful attempt to find out the size of the tumor, whether the cancer has spread, and if so, to what parts of the body. You may need to have more tests to determine the stage of your cancer. The test results will help determine what treatment plan is best for you.   Stage 0 The cancer is found only in the innermost lining of the colon or rectum.   Stage I The cancer has grown into the inner wall of the colon or rectum. The cancer has not yet reached the outer wall of the colon.   Stage II The cancer extends more deeply into or through the wall of the colon or rectum. It  may have invaded nearby tissue, but cancer cells have not spread to the lymph nodes.   Stage III The cancer has spread to nearby lymph nodes but not to other parts of the body.   Stage IV The cancer has spread to other parts of the body, such as the liver or lungs.  Your health care provider may tell you the detailed stage of your cancer, which includes both a number and a letter.  TREATMENT  Depending on the type and stage, colorectal cancer may be treated with surgery, radiation therapy, chemotherapy, targeted therapy, or radiofrequency ablation. Some people have a combination of these therapies. Surgery may be done to remove the polyps from your colon. In early stages, your health care provider may be able to do this during a colonoscopy. In later stages, surgery may be  done to remove part of your colon.  HOME CARE INSTRUCTIONS   Only take over-the-counter or prescription medicines for pain, discomfort, or fever as directed by your health care provider.   Maintain a healthy diet.   Consider joining a support group. This may help you learn to cope with the stress of having colorectal cancer.   Seek advice to help you manage treatment of side effects.   Keep all follow-up appointments as directed by your health care provider.   Inform your cancer specialist if you are admitted to the hospital.  SEEK MEDICAL CARE IF:  Your diarrhea or constipation does not go away.   Your bowel habits change.  You have increased abdominal pain.   You notice new fatigue or weakness.  You lose weight. Document Released: 06/06/2005 Document Revised: 02/06/2013 Document Reviewed: 11/29/2012 [facility] Patient Information 2014 Rick Davenport.

## 2013-10-08 NOTE — Progress Notes (Signed)
Reviewed discharge instructions and gave pt printed copy which included d/c medications, follow up appointments, and home care instructions.  Pt verbalized understanding of all instructions and will be d/c to home.  Thressa Sheller, RN, RS.

## 2013-10-08 NOTE — Progress Notes (Signed)
Went to see the patient today to discuss his surgical pathology report: Surgical pathology: Diagnosis 1. Omentum, resection for tumor, nodule -ONE LYMPH NODE, POSITIVE FOR TUMOR (1/1), SEE COMMENT. 2. Colon, segmental resection for tumor, right colon - INVASIVE MODERATELY TO POORLY DIFFERENTIATED ADENOCARCINOMA, SEE COMMENT. - TUMOR EXTENDS INTO PERICOLONIC SOFT TISSUE WITH FOCAL INVOLVEMENT OF VISCERAL PERITONEUM. - LYMPH VASCULAR INVASION IDENTIFIED. - EIGHT LYMPH NODES, POSITIVE FOR TUMOR (8/17). - BENIGN APPENDIX WITH FIBROUS OBLITERATION; NEGATIVE FOR ATYPIA OR MALIGNANCY. - SURGICAL MARGINS, NEGATIVE FOR TUMOR. - SEE TUMOR SYNOPTIC TEMPLATE BELOW.  I let him know that he has metastasis of his colon cancer (adenocarcinoma with involvement to his local lymph nodes and mesenteric lymph nodes.  I discussed that he will need additional workup including additional labs and imaging studies to finish the staging workup, but that he will likely qualify for chemotherapy.  He is as to be expected after the news.  He asked me to call his oldest brother Earlie Server and his son Mikel Cella.  I was able to get in touch with both via phone and explain his current diagnosis of colon cancer.  His brother Earlie Server says that he in fact had prostate cancer which was caught early and he is in remission, and their father had lung cancer.  No history of colon cancer in the family.  They are both naturally concerned and asked multiple relevant questions.  His son lives here in town, but the rest of his family lives in [place].    Noted that the CT scan had an addendum to report which reads: ADDENDUM:  A moderate amount of thrombus/ eccentric atherosclerotic plaque is  present within the right external iliac artery (64-65) with distal  vessels patent.  Discussed with [facility] and Dr. Oneida Alar who will give a formal consult to determine if he needs any treatment for this.  Appreciate their consultation.

## 2013-10-08 NOTE — Discharge Summary (Signed)
I have seen and examined the patient and agree with the assessment and plans.  Maquita Sandoval A. Pihu Basil  MD, FACS  

## 2013-10-09 ENCOUNTER — Telehealth: Payer: Self-pay | Admitting: *Deleted

## 2013-10-09 NOTE — Telephone Encounter (Signed)
Spoke with patient and confirmed appointment with Dr. Benay Spice for 10/25/13.  Contact names and numbers were provided.

## 2013-10-10 ENCOUNTER — Telehealth: Payer: Self-pay | Admitting: Vascular Surgery

## 2013-10-10 NOTE — Telephone Encounter (Addendum)
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 with dr. Oneida Alar on 11-14-13 at 8:30 for abi's and 9:00 to see dr.  Oneida Alar

## 2013-10-14 ENCOUNTER — Ambulatory Visit (INDEPENDENT_AMBULATORY_CARE_PROVIDER_SITE_OTHER): Payer: Medicaid Other | Admitting: Surgery

## 2013-10-14 ENCOUNTER — Encounter (INDEPENDENT_AMBULATORY_CARE_PROVIDER_SITE_OTHER): Payer: Self-pay | Admitting: Surgery

## 2013-10-14 VITALS — BP 111/71 | HR 62 | Temp 98.1°F | Resp 12 | Ht 72.0 in | Wt 163.4 lb

## 2013-10-14 DIAGNOSIS — C180 Malignant neoplasm of cecum: Secondary | ICD-10-CM

## 2013-10-14 NOTE — Progress Notes (Signed)
Status post right hemicolectomy on 10/04/13 for an obstructing right colon cancer. He had positive lymph nodes. The patient did well postoperatively and has been fairly comfortable at home. Appetite is good. His bowel movements are returning to normal. He denies any bleeding with bowel movements. He has an appointment to see medical oncology next week. Overall he feels that he is doing well. He is not requiring any pain medication.  Filed Vitals:   10/14/13 0828  BP: 111/71  Pulse: 62  Temp: 98.1 F (36.7 C)  Resp: 12   Abd - soft, non-tender; healed incision with staples + BS Staples are removed and replaced with steri-strips.   PATHOLOGY REPORT  Patient: GANESH, DEEG Collected: 10/04/2013 Client: [place] Accession: [id number] Received: 10/04/2013 Donnie Mesa, MD DOB: 1960-12-27 Age: 53 Gender: M Reported: 10/07/2013 [address] [place] Patient Ph: [phone] MRN #: [id number] [place], [place] [address] Visit #: [id number] Chart #: Phone:  Fax: CC: REPORT OF SURGICAL PATHOLOGY ADDITIONAL INFORMATION: 2. Mismatch Repair (MMR) Protein Immunohistochemistry (IHC) IHC Expression Result: MLH1: Preserved nuclear expression (greater 50% tumor expression) MSH2: Preserved nuclear expression (greater 50% tumor expression) MSH6: Preserved nuclear expression (greater 50% tumor expression) PMS2: Preserved nuclear expression (greater 50% tumor expression) * Internal control demonstrates intact nuclear expression Interpretation: NORMAL There is preserved expression of the major and minor MMR proteins. There is a very low probability that microsatellite instability (MSI) is present. However, certain clinically significant MMR protein mutations may result in preservation of nuclear expression. It is recommended that the preservation of protein expression be correlated with molecular based MSI testing. References: 1. Guidelines on Genetic Evaluation and Management of Lynch  Syndrome: A Consensus Statement by the [place] Multi-Society Task Force on Colorectal Cancer Gae Dry. Sherlie Ban , MD, and others . Am Nicki [place] 2014; [phone]; doi: 10.1038/ajg.2014.186; published online 08 January 2013 2. Outcomes of screening endometrial cancer patients for Lynch syndrome by patient-administered checklist. Olena Heckle MS, and others. Gynecol Oncol 2013;131(3):619-623. Susanne Greenhouse MD Pathologist, Electronic Signature ( Signed 10/10/2013) FINAL DIAGNOSIS Diagnosis 1. Omentum, resection for tumor, nodule ONE LYMPH NODE, POSITIVE FOR TUMOR (1/1), SEE COMMENT. 2. Colon, segmental resection for tumor, right colon - INVASIVE MODERATELY TO POORLY DIFFERENTIATED ADENOCARCINOMA, SEE COMMENT. 1 of 4 FINAL for [place], [place] [phone]) Diagnosis(continued) - TUMOR EXTENDS INTO PERICOLONIC SOFT TISSUE WITH FOCAL INVOLVEMENT OF VISCERAL PERITONEUM. - LYMPH VASCULAR INVASION IDENTIFIED. - EIGHT LYMPH NODES, POSITIVE FOR TUMOR (8/17). - BENIGN APPENDIX WITH FIBROUS OBLITERATION; NEGATIVE FOR ATYPIA OR MALIGNANCY. - SURGICAL MARGINS, NEGATIVE FOR TUMOR. - SEE TUMOR SYNOPTIC TEMPLATE BELOW. Microscopic Comment 1. Representative sections of the 2.5 cm nodular lesion identified within the omental adipose tissue demonstrates diagnostic features of near total replacement of lymph node by metastatic tumor. 2. COLON AND RECTUM (INCLUDING TRANS-ANAL RESECTION): Specimen: Right colon Procedure: Colectomy Tumor site: Proximal cecum Macroscopic intactness of mesorectum: N/A Macroscopic tumor perforation: Absent Invasive tumor: Maximum size: 1.0 cm Histologic type(s): Adenocarcinoma Histologic grade and differentiation: G3: poorly differentiated/high grade Type of polyp in which invasive carcinoma arose: N/A Microscopic extension of invasive tumor: Extends into pericolonic soft tissue with focal involvement of visceral peritoneum. Lymph-Vascular invasion: Present Peri-neural  invasion: Present Tumor deposit(s) (discontinuous extramural extension): Present (x1) Resection margins: Proximal margin: Negative Distal margin: Negative Circumferential (radial) (posterior ascending, posterior descending; lateral and posterior mid-rectum; and entire lower 1/3 rectum): Negative Mesenteric margin (sigmoid and transverse): N/A Distance closest margin (if all above margins negative): 9.4 cm (proximal) Trans-anal resection margins only: Deep margin: N/A Mucosal  Margin: N/A Distance closest mucosal margin (if negative): N/A Treatment effect (neo-adjuvant therapy): None Additional polyp(s): Benign mucosal polyp (mucosal tag) (slide 2D). Lymph nodes: number examined 17; number positive: 8 Pathologic Staging: pT4a, pN2b, pM1a, see comment Ancillary studies: Per the guidelines of the [place] gastrointestinal working group, a portion of tumor will be submitted for PCR testing for microsatellite instability and assessment of mismatch repair protein expression by immunohistochemistry. There results will be reported in an addendum. Comment: Given that the lymph node involved in omentum (Part 1) is a non-regional lymph node, the pathologic M stage is reported to be pM1a. (CR:kh 10-07-13) Mali RUND DO Pathologist, Electronic Signature (Case signed 10/07/2013) 2 of 4 FINAL for [place], [place] ([id number]) Specimen Gross and Clinical Information Specimen(s) Obtained: 1. Omentum, resection for tumor, nodule 2. Colon, segmental resection for tumor, right colon Specimen Clinical Information 1. Obstructing right colon cancer (je) Gross 1. Received in saline and consists of a 4.9 x 2.5 x 1.2 cm piece of yellow brown adipose tissue. Sectioning reveals a 2.5 x 1.8 x 1.1 cm tan white, firm nodular lesion within the fat. Representative sections are submitted in two cassettes. 2. Specimen: Received in formalin labeled right colon. Specimen integrity: Intact with two stapled resection  margins. Specimen length: There is 10.5 cm of terminal ileum and 28.0 cm of right colon. Mesorectal intactness: N/A Tumor location: Within the cecum. Tumor size: There is a circumferential 11.0 x 8.8 cm tan gray, exophytic, firm lesion with areas of depression and necrosis. The very proximal portion of the cecum (including appendiceal orifice) is grossly uninvolved. Percent of bowel circumference involved: 100%. Tumor distance to margins: Proximal: 9.4 cm Distal: 17.0 cm Macroscopic extent of tumor invasion: The tumor invades through the muscularis propria into the subserosal adipose tissue. Total presumed lymph nodes: Fourteen possible lymph nodes are identified, ranging from 0.5 to 2.5 x 1.9 x 0.8 cm. Extramural satellite tumor nodules: Underlying the main tumor and mass is a 2.7 x 2.6 x 2.2 cm tan yellow, softened nodule (possible enlarged lymph node versus satellite tumor nodule). Two additional tan white, firm, irregularly shaped nodules are identified within the surrounding adipose tissue, measuring 1.2 and 1.4 cm in greatest dimension. Mucosal polyp(s): 0.6 cm tan gray sessile possible polyp is identified, measuring 6.5 cm distal to the main tumor mass. Sectioning reveals a tan submucosal nodule. A 0.2 cm sessile tan polyp is identified, measuring 8.0 cm distal to the main tumor mass. Additional findings: The terminal ileum mucosa is tan and edematous, and the wall measures 0.6 cm in thickness. The remaining colonic mucosa is brown pink and slight dusky, and the wall measures 0.3 cm in thickness. The lumen of the terminal ileum and right colon contain an abundant amount of brown green liquid fecal material. The mucosa of the right colon displays a 1.5 cm in greatest dimension area of red brown submucosal possible hemorrhage. The appendix is identified, and measures 7.8 cm in length x 0.7 cm in diameter. The serosa is tan white and smooth, the mucosa is tan, and the wall measures 0.3  cm in thickness and the lumen is grossly unremarkable. Block summary: A,B= proximal resection margin. C= distal resection margin. D= additional mucosal polyps. E-K= main tumor mass. L,M= enlarged adipose nodule. N= possible satellite nodules. O= representative mucosa, to include submucosal hemorrhage of right colon. P= appendix. Q= three possible lymph nodes. 3 of 4 FINAL for [place], [place] [phone]) Gross(continued) R-U= two possible lymph nodes each, differentially inked and bisected.  V= one possible lymph node, bisected. W= one possible lymph node, bisected, X,Y= one possible lymph node, bisected. (KL:gt, 10/07/13) Stain(s) used in Diagnosis: The following stain(s) were used in diagnosing the case: MSH6, MLH1, MSH2, PMS2. The control(s) stained appropriately. Disclaimer Some of these immunohistochemical stains may have been developed and the performance characteristics determined by [facility]. Some may not have been cleared or approved by the U.S. Food and Drug Administration. The FDA has determined that such clearance or approval is not necessary. This test is used for clinical purposes. It should not be regarded as investigational or for research. This laboratory is certified under the [place] (CLIA-88) as qualified to perform high complexity clinical laboratory testing. Report signed out from the following location(s) Technical Component performed at [facility], [place], [place] [address]. CLIA #: [id number], Technical Component performed at [facility], [place], [place] [address]. CLIA #: [id number], Interpretation performed at [facility], [place], [place] [address]. CLIA #: [id number], 4 of   Imp:  pT4a, pN2b, pM1a adenocarcinoma of the right colon Doing well post-op  Plan:  Slowly increase level of activity Regular  diet  Oncology referral to Dr. Benay Spice. Follow-up 4 weeks.  Imogene Burn. Georgette Dover, MD, [facility]  General/ Trauma Surgery  10/14/2013 2:09 PM

## 2013-10-15 ENCOUNTER — Encounter (HOSPITAL_COMMUNITY): Payer: Self-pay

## 2013-10-15 ENCOUNTER — Encounter: Payer: Self-pay | Admitting: Internal Medicine

## 2013-10-15 ENCOUNTER — Ambulatory Visit: Payer: Medicaid Other | Attending: Internal Medicine | Admitting: Internal Medicine

## 2013-10-15 VITALS — BP 110/76 | HR 68 | Temp 98.3°F | Resp 14 | Ht 72.0 in | Wt 163.0 lb

## 2013-10-15 DIAGNOSIS — Z7189 Other specified counseling: Secondary | ICD-10-CM

## 2013-10-15 DIAGNOSIS — F17200 Nicotine dependence, unspecified, uncomplicated: Secondary | ICD-10-CM | POA: Insufficient documentation

## 2013-10-15 DIAGNOSIS — Z7689 Persons encountering health services in other specified circumstances: Secondary | ICD-10-CM

## 2013-10-15 DIAGNOSIS — Z09 Encounter for follow-up examination after completed treatment for conditions other than malignant neoplasm: Secondary | ICD-10-CM | POA: Insufficient documentation

## 2013-10-15 DIAGNOSIS — C189 Malignant neoplasm of colon, unspecified: Secondary | ICD-10-CM

## 2013-10-15 NOTE — Progress Notes (Signed)
[facility] post surgery for colon cancer Patient here to establish care

## 2013-10-15 NOTE — Patient Instructions (Signed)
DASH Diet The DASH diet stands for "Dietary Approaches to Stop Hypertension." It is a healthy eating plan that has been shown to reduce high blood pressure (hypertension) in as little as 14 days, while also possibly providing other significant health benefits. These other health benefits include reducing the risk of breast cancer after menopause and reducing the risk of type 2 diabetes, heart disease, colon cancer, and stroke. Health benefits also include weight loss and slowing kidney failure in patients with chronic kidney disease.  DIET GUIDELINES  Limit salt (sodium). Your diet should contain less than 1500 mg of sodium daily.  Limit refined or processed carbohydrates. Your diet should include mostly whole grains. Desserts and added sugars should be used sparingly.  Include small amounts of heart-healthy fats. These types of fats include nuts, oils, and tub margarine. Limit saturated and trans fats. These fats have been shown to be harmful in the body. CHOOSING FOODS  The following food groups are based on a 2000 calorie diet. See your Registered Dietitian for individual calorie needs. Grains and Grain Products (6 to 8 servings daily)  Eat More Often: Whole-wheat bread, brown rice, whole-grain or wheat pasta, quinoa, popcorn without added fat or salt (air popped).  Eat Less Often: White bread, white pasta, white rice, cornbread. Vegetables (4 to 5 servings daily)  Eat More Often: Fresh, frozen, and canned vegetables. Vegetables may be raw, steamed, roasted, or grilled with a minimal amount of fat.  Eat Less Often/Avoid: Creamed or fried vegetables. Vegetables in a cheese sauce. Fruit (4 to 5 servings daily)  Eat More Often: All fresh, canned (in natural juice), or frozen fruits. Dried fruits without added sugar. One hundred percent fruit juice ( cup [237 mL] daily).  Eat Less Often: Dried fruits with added sugar. Canned fruit in light or heavy syrup. Lean Meats, Fish, and Poultry (2  servings or less daily. One serving is 3 to 4 oz [85-114 g]).  Eat More Often: Ninety percent or leaner ground beef, tenderloin, sirloin. Round cuts of beef, chicken breast, turkey breast. All fish. Grill, bake, or broil your meat. Nothing should be fried.  Eat Less Often/Avoid: Fatty cuts of meat, turkey, or chicken leg, thigh, or wing. Fried cuts of meat or fish. Dairy (2 to 3 servings)  Eat More Often: Low-fat or fat-free milk, low-fat plain or light yogurt, reduced-fat or part-skim cheese.  Eat Less Often/Avoid: Milk (whole, 2%).Whole milk yogurt. Full-fat cheeses. Nuts, Seeds, and Legumes (4 to 5 servings per week)  Eat More Often: All without added salt.  Eat Less Often/Avoid: Salted nuts and seeds, canned beans with added salt. Fats and Sweets (limited)  Eat More Often: Vegetable oils, tub margarines without trans fats, sugar-free gelatin. Mayonnaise and salad dressings.  Eat Less Often/Avoid: Coconut oils, palm oils, butter, stick margarine, cream, half and half, cookies, candy, pie. FOR MORE INFORMATION The Dash Diet Eating Plan: www.dashdiet.org Document Released: 05/26/2011 Document Revised: 08/29/2011 Document Reviewed: 05/26/2011 ExitCare Patient Information 2014 [employer]. Smoking Cessation Quitting smoking is important to your health and has many advantages. However, it is not always easy to quit since nicotine is a very addictive drug. Often times, people try 3 times or more before being able to quit. This document explains the best ways for you to prepare to quit smoking. Quitting takes hard work and a lot of effort, but you can do it. ADVANTAGES OF QUITTING SMOKING  You will live longer, feel better, and live better.  Your body will feel the   impact of quitting smoking almost immediately.  Within 20 minutes, blood pressure decreases. Your pulse returns to its normal level.  After 8 hours, carbon monoxide levels in the blood return to normal. Your oxygen level  increases.  After 24 hours, the chance of having a heart attack starts to decrease. Your breath, hair, and body stop smelling like smoke.  After 48 hours, damaged nerve endings begin to recover. Your sense of taste and smell improve.  After 72 hours, the body is virtually free of nicotine. Your bronchial tubes relax and breathing becomes easier.  After 2 to 12 weeks, lungs can hold more air. Exercise becomes easier and circulation improves.  The risk of having a heart attack, stroke, cancer, or lung disease is greatly reduced.  After 1 year, the risk of coronary heart disease is cut in half.  After 5 years, the risk of stroke falls to the same as a nonsmoker.  After 10 years, the risk of lung cancer is cut in half and the risk of other cancers decreases significantly.  After 15 years, the risk of coronary heart disease drops, usually to the level of a nonsmoker.  If you are pregnant, quitting smoking will improve your chances of having a healthy baby.  The people you live with, especially any children, will be healthier.  You will have extra money to spend on things other than cigarettes. QUESTIONS TO THINK ABOUT BEFORE ATTEMPTING TO QUIT You may want to talk about your answers with your caregiver.  Why do you want to quit?  If you tried to quit in the past, what helped and what did not?  What will be the most difficult situations for you after you quit? How will you plan to handle them?  Who can help you through the tough times? Your family? Friends? A caregiver?  What pleasures do you get from smoking? What ways can you still get pleasure if you quit? Here are some questions to ask your caregiver:  How can you help me to be successful at quitting?  What medicine do you think would be best for me and how should I take it?  What should I do if I need more help?  What is smoking withdrawal like? How can I get information on withdrawal? GET READY  Set a quit  date.  Change your environment by getting rid of all cigarettes, ashtrays, matches, and lighters in your home, car, or work. Do not let people smoke in your home.  Review your past attempts to quit. Think about what worked and what did not. GET SUPPORT AND ENCOURAGEMENT You have a better chance of being successful if you have help. You can get support in many ways.  Tell your family, friends, and co-workers that you are going to quit and need their support. Ask them not to smoke around you.  Get individual, group, or telephone counseling and support. Programs are available at local hospitals and health centers. Call your local health department for information about programs in your area.  Spiritual beliefs and practices may help some smokers quit.  Download a "quit meter" on your computer to keep track of quit statistics, such as how long you have gone without smoking, cigarettes not smoked, and money saved.  Get a self-help book about quitting smoking and staying off of tobacco. LEARN NEW SKILLS AND BEHAVIORS  Distract yourself from urges to smoke. Talk to someone, go for a walk, or occupy your time with a task.  Change your   normal routine. Take a different route to work. Drink tea instead of coffee. Eat breakfast in a different place.  Reduce your stress. Take a hot bath, exercise, or read a book.  Plan something enjoyable to do every day. Reward yourself for not smoking.  Explore interactive web-based programs that specialize in helping you quit. GET MEDICINE AND USE IT CORRECTLY Medicines can help you stop smoking and decrease the urge to smoke. Combining medicine with the above behavioral methods and support can greatly increase your chances of successfully quitting smoking.  Nicotine replacement therapy helps deliver nicotine to your body without the negative effects and risks of smoking. Nicotine replacement therapy includes nicotine gum, lozenges, inhalers, nasal sprays, and  skin patches. Some may be available over-the-counter and others require a prescription.  Antidepressant medicine helps people abstain from smoking, but how this works is unknown. This medicine is available by prescription.  Nicotinic receptor partial agonist medicine simulates the effect of nicotine in your brain. This medicine is available by prescription. Ask your caregiver for advice about which medicines to use and how to use them based on your health history. Your caregiver will tell you what side effects to look out for if you choose to be on a medicine or therapy. Carefully read the information on the package. Do not use any other product containing nicotine while using a nicotine replacement product.  RELAPSE OR DIFFICULT SITUATIONS Most relapses occur within the first 3 months after quitting. Do not be discouraged if you start smoking again. Remember, most people try several times before finally quitting. You may have symptoms of withdrawal because your body is used to nicotine. You may crave cigarettes, be irritable, feel very hungry, cough often, get headaches, or have difficulty concentrating. The withdrawal symptoms are only temporary. They are strongest when you first quit, but they will go away within 10 14 days. To reduce the chances of relapse, try to:  Avoid drinking alcohol. Drinking lowers your chances of successfully quitting.  Reduce the amount of caffeine you consume. Once you quit smoking, the amount of caffeine in your body increases and can give you symptoms, such as a rapid heartbeat, sweating, and anxiety.  Avoid smokers because they can make you want to smoke.  Do not let weight gain distract you. Many smokers will gain weight when they quit, usually less than 10 pounds. Eat a healthy diet and stay active. You can always lose the weight gained after you quit.  Find ways to improve your mood other than smoking. FOR MORE INFORMATION  www.smokefree.gov  Document  Released: 05/31/2001 Document Revised: 12/06/2011 Document Reviewed: 09/15/2011 ExitCare Patient Information 2014 [employer].  

## 2013-10-16 NOTE — Progress Notes (Signed)
Patient ID: Rick Davenport, male   DOB: 1960/09/13, 53 y.o.   MRN: [id number]   YHC:[id number]  DVV:[id number]  DOB - 1960-12-11  CC:  Chief Complaint  Patient presents with  . Hospitalization Follow-up  . Colon Cancer       HPI: Rick Davenport is a 53 y.o. male here today as a hospital followup for a new diagnosis of colon cancer. Patient reports that he recently followed up with the oncologist to manage his care and pain. Patient was told by oncologist that he needs a primary care physician and would like to establish care here. Patient has no complaints at this time. Patient has No headache, No chest pain, No new weakness tingling or numbness, No Cough - SOB.  No Known Allergies History reviewed. No pertinent past medical history. Current Outpatient Prescriptions on File Prior to Visit  Medication Sig Dispense Refill  . HYDROcodone-acetaminophen (NORCO/VICODIN) 5-325 MG per tablet Take 1-2 tablets by mouth every 6 (six) hours as needed for moderate pain or severe pain.  40 tablet  0   No current facility-administered medications on file prior to visit.   Family History  Problem Relation Age of Onset  . Diabetes Mother   . Lung cancer Father   . Prostate cancer Brother     Oldest brother   History   Social History  . Marital Status: Single    Spouse Name: N/A    Number of Children: N/A  . Years of Education: N/A   Occupational History  . Not on file.   Social History Main Topics  . Smoking status: Current Every Day Smoker -- 1.00 packs/day for 40 years    Types: Cigarettes  . Smokeless tobacco: Not on file  . Alcohol Use: 0.6 oz/week    1 Cans of beer per week     Comment: 1 can of beer per day  . Drug Use: No  . Sexual Activity: Not on file   Other Topics Concern  . Not on file   Social History Narrative  . No narrative on file    Review of Systems: Constitutional: Negative for fever, chills, diaphoresis, activity change, appetite change and  fatigue. HENT: Negative for ear pain, nosebleeds, congestion, facial swelling, rhinorrhea, neck pain, neck stiffness and ear discharge.  Eyes: Negative for pain, discharge, redness, itching and visual disturbance. Respiratory: Negative for cough, choking, chest tightness, shortness of breath, wheezing and stridor.  Cardiovascular: Negative for chest pain, palpitations and leg swelling. Gastrointestinal: Positive for abdominal pain, reports bowel movements every 2 days  Genitourinary: Negative for dysuria, urgency, frequency, hematuria, flank pain, decreased urine volume, difficulty urinating and dyspareunia.  Musculoskeletal: Negative for back pain, joint swelling, arthralgia and gait problem. Neurological: Negative for dizziness, tremors, seizures, syncope, facial asymmetry, speech difficulty, weakness, light-headedness, numbness and headaches.  Hematological: Negative for adenopathy. Does not bruise/bleed easily. Psychiatric/Behavioral: Negative for hallucinations, behavioral problems, confusion, dysphoric mood, decreased concentration and agitation.    Objective:   Filed Vitals:   10/15/13 1735  BP: 110/76  Pulse: 68  Temp: 98.3 F (36.8 C)  Resp: 14    Physical Exam: Constitutional: Patient appears well-developed and well-nourished. No distress. HENT: Normocephalic, atraumatic, External right and left ear normal. Oropharynx is clear and moist.  Eyes: Conjunctivae and EOM are normal. PERRLA, no scleral icterus. Neck: Normal ROM. Neck supple. No JVD. No tracheal deviation. No thyromegaly. CVS: RRR, S1/S2 +, no murmurs, no gallops, no carotid bruit.  Pulmonary: Effort and breath sounds normal, no stridor,  rhonchi, wheezes, rales.  Abdominal: Soft. BS +, no distension. Positive for generalized abdominal tenderness  Musculoskeletal: Normal range of motion. No edema and no tenderness.  Lymphadenopathy: No lymphadenopathy noted, cervical Neuro: Alert. Normal reflexes, muscle tone  coordination. No cranial nerve deficit. Skin: Skin is warm and dry. No rash noted. Not diaphoretic. No erythema. No pallor. Psychiatric: Normal mood and affect. Behavior, judgment, thought content normal.  Lab Results  Component Value Date   WBC 8.4 10/08/2013   HGB 8.3* 10/08/2013   HCT 26.5* 10/08/2013   MCV 65.9* 10/08/2013   PLT 385 10/08/2013   Lab Results  Component Value Date   CREATININE 0.72 10/06/2013   BUN 7 10/06/2013   NA 138 10/06/2013   K 4.1 10/06/2013   CL 104 10/06/2013   CO2 20 10/06/2013    No results found for this basename: HGBA1C   Lipid Panel  No results found for this basename: chol, trig, hdl, cholhdl, vldl, ldlcalc       Assessment and plan:   Rick Davenport was seen today for hospitalization follow-up and colon cancer.  Diagnoses and associated orders for this visit:  Colon cancer Continues to follow with oncologist for management of colon cancer  Smoking Patient reports he has cut back to 3 cigarettes per day. Patient counseled on smoking cessation and printed information given.  Encounter to establish care - Lipid Panel; Future - TSH; Future - Hemoglobin A1c; Future   Return in about 1 year (around 10/16/2014), or if symptoms worsen or fail to improve, for colon cancer. Patient should followup for baseline labs.    Chari Manning, NP-C [facility] [phone] 10/16/2013, 5:10 PM

## 2013-10-25 ENCOUNTER — Telehealth (INDEPENDENT_AMBULATORY_CARE_PROVIDER_SITE_OTHER): Payer: Self-pay

## 2013-10-25 ENCOUNTER — Telehealth: Payer: Self-pay | Admitting: Oncology

## 2013-10-25 ENCOUNTER — Ambulatory Visit: Payer: Self-pay

## 2013-10-25 ENCOUNTER — Encounter: Payer: Self-pay | Admitting: Oncology

## 2013-10-25 ENCOUNTER — Ambulatory Visit (HOSPITAL_BASED_OUTPATIENT_CLINIC_OR_DEPARTMENT_OTHER): Payer: Self-pay | Admitting: Oncology

## 2013-10-25 VITALS — BP 100/68 | HR 99 | Temp 98.9°F | Resp 18 | Ht 72.0 in | Wt 157.2 lb

## 2013-10-25 DIAGNOSIS — C772 Secondary and unspecified malignant neoplasm of intra-abdominal lymph nodes: Secondary | ICD-10-CM

## 2013-10-25 DIAGNOSIS — D649 Anemia, unspecified: Secondary | ICD-10-CM

## 2013-10-25 DIAGNOSIS — C180 Malignant neoplasm of cecum: Secondary | ICD-10-CM

## 2013-10-25 DIAGNOSIS — C189 Malignant neoplasm of colon, unspecified: Secondary | ICD-10-CM

## 2013-10-25 MED ORDER — FERROUS SULFATE 325 (65 FE) MG PO TBEC: 325.0000 mg | DELAYED_RELEASE_TABLET | Freq: Two times a day (BID) | ORAL | Status: DC

## 2013-10-25 NOTE — Telephone Encounter (Signed)
Dr Benay Spice requesting pt to get scheduled for a PAC b/c the pt is scheduled to start chemo on 11/06/13. This pt is a s/p rt hemicolectomy done on 10/04/13 by Dr Georgette Dover. I advised Rose that we would have to send message to Dr Georgette Dover so he could advise on the [facility] and give surgical orders if needed. Rose at the [facility] would like to be notified of the [facility] date once scheduled.

## 2013-10-25 NOTE — Progress Notes (Signed)
[facility] Patient Consult   Referring Davenport: Rick Davenport    Reason for Referral: Colon cancer   HPI: He presented to the emergency room 10/03/2013 with abdominal pain. He reports the pain and impressive for a few days and was associated with nausea/vomiting and constipation.  A CT of the abdomen and pelvis revealed clear lung bases. No focal hepatic lesion. Dilated loops of distal small bowel were noted. Irregular thickening of the cecum. Several small lymph nodes were noted along the pericolic gutter inferior to the sacral temp with a large rounded lymph nodes in the ileocecal mesentery. Minimal stool in the distal colon. No retroperitoneal periportal lymphadenopathy. Rick Davenport was consult at and a colonoscopy could not be performed secondary to the obstruction.  Rick Davenport was consulted and he was taken to the operating room for 7 2015 4 an exploratory laparotomy and right hemicolectomy no liver masses. No peritoneal seeding. A firm mass was noted in the right lower quadrant measuring 12 cm. The small bowel proximal to this was mildly dilated the mid transverse colon and terminal ileum were divided. A right colectomy was performed. A firm nodule was palpated in the upper abdomen omentum. This was resected.  The pathology [id number]) confirmed an invasive moderate to poorly differentiated adenocarcinoma. Tumor extended into pericolonic soft tissue with focal involvement of the visceral peritoneum. Lymphovascular invasion and perineural invasion were present. The resection margins were negative. No macroscopic tumor perforation. 8 of 17 lymph nodes contained metastatic carcinoma. The omental nodule returned as a lymph node involved with tumor. The tumor was located at the cecum.  He reports an uneventful operative recovery.  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 He reported claudication. He is to be  scheduled for outpatient followup.  Past medical history: None   Past Surgical History  Procedure Laterality Date  . Partial colectomy Right 10/04/2013    Procedure: RIGHT HEMICOLECTOMY ;  Surgeon: Rick Davenport;  Location: [place] OR;  Service: General;  Laterality: Right;    Medications: Reviewed  Allergies: No Known Allergies  Family history: 2 brothers and 2 sisters, no family history of cancer. He has 4 children.  Social History:   He lives with friends in [place]. He previously worked in [employer]. He smokes cigarettes. He quit alcohol use on hospital admission 10/03/2013 .He was transfused with packed red blood cells in the hospital. No risk factor for HIV or hepatitis.  History  Alcohol Use  . 0.6 oz/week  . 1 Cans of beer per week    Comment: 1 can of beer per day    History  Smoking status  . Current Every Day Smoker -- 1.00 packs/day for 40 years  . Types: Cigarettes  Smokeless tobacco  . Not on file      ROS:   Positives include: Constipation, nausea/vomiting, and abdominal pain prior to the right colectomy. Anorexia and a 40 pound weight loss.  A complete ROS was otherwise negative.  Physical Exam:  Blood pressure 100/68, pulse 99, temperature 98.9 F (37.2 C), temperature source Oral, resp. rate 18, height 6' (1.829 m), weight 157 lb 3.2 oz (71.305 kg), SpO2 100.00%.  HEENT: Oropharynx without visible mass, neck without mass Lungs: Clear bilaterally Cardiac: Regular rate and rhythm Abdomen: Healed surgical incisions, no hepatomegaly, nontender, no mass GU: Circumcised male, testes without mass  Vascular: No leg edema Lymph nodes: No cervical, supraclavicular, or inguinal nodes. "  Shotty "bilateral axillary nodes. Neurologic: Alert and oriented, the motor exam appears intact in the upper and lower extremities Skin: No rash, inclusion cyst measuring approximately 1 cm in the right axilla Musculoskeletal: No spine  tenderness   LAB:  CBC  Lab Results  Component Value Date   WBC 8.4 10/08/2013   HGB 8.3* 10/08/2013   HCT 26.5* 10/08/2013   MCV 65.9* 10/08/2013   PLT 385 10/08/2013   NEUTROABS 9.4* 10/03/2013     CMP      Component Value Date/Time   NA 138 10/06/2013 0920   K 4.1 10/06/2013 0920   CL 104 10/06/2013 0920   CO2 20 10/06/2013 0920   GLUCOSE 70 10/06/2013 0920   BUN 7 10/06/2013 0920   CREATININE 0.72 10/06/2013 0920   CALCIUM 8.4 10/06/2013 0920   PROT 6.8 10/03/2013 0900   ALBUMIN 3.1* 10/03/2013 0900   AST 13 10/03/2013 0900   ALT 9 10/03/2013 0900   ALKPHOS 71 10/03/2013 0900   BILITOT 0.3 10/03/2013 0900   GFRNONAA >90 10/06/2013 0920   GFRAA >90 10/06/2013 0920    Lab Results  Component Value Date   CEA <0.5 10/03/2013    Imaging:  As per history of present illness   Assessment/Plan:   1. Clinical stage IV (T4, N2b,M1a) adenocarcinoma of the cecum, status post a right colectomy 10/04/2013  No loss of mismatch repair protein expression, microsatellite stable  Omental nodule resected at the time of surgery 10/04/2013 with the pathology confirming a lymph node metastasis 2. Microcytic anemia-likely iron deficiency anemia 3. History of tobacco use 4. 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 He has advanced stage disease based on the surgical pathology result. I reviewed the details of the pathology report with Rick Davenport. The omental lymph node likely represents distant metastatic disease.  He will be referred for a chemotherapy teaching class and staging CT of the chest. We will refer him to [facility] for placement of a Port-A-Cath. I will present his case at the GI tumor conference to clarify the clinical staging.  I recommend systemic chemotherapy, most likely FOLFOX. We discussed the potential toxicities associated with the FOLFOX regimen including the chance  for nausea/vomiting, mucositis, diarrhea, alopecia, and hematologic toxicity. We reviewed the rash, hyperpigmentation, and hand/foot syndrome associated with 5-fluorouracil. We discussed the various types of neuropathy seen with oxaliplatin. He agrees to proceed.  We will consider adding biologic therapy based on the final clinical staging.  Rick Davenport will begin ferrous sulfate. He will return for an office visit 11/06/2013.  He understands the increased risk of colon cancer in his children. He will recommend they have a screening colonoscopy.  Approximately 50 minutes were spent with patient today. The majority of the time was used for counseling and coordination of care.  Ladell Pier 10/25/2013, 5:54 PM

## 2013-10-25 NOTE — Progress Notes (Signed)
Checked in new patient. He has applied for medicaid when he was in the hospital. He is aware of self pay until approved.

## 2013-10-25 NOTE — Progress Notes (Signed)
Met with [facility]. Explained role of nurse navigator. Educational information provided on colon cancer  Referral made to dietician for diet education and to SW for assessment of needs.Other [place] resources provided to patient, including support group information.  Patient stated he lives with friends and has support if needed.  Contact names and phone numbers were provided for entire [facility] team.  Teach back method was used.  Will continue to follow as needed.

## 2013-10-25 NOTE — Telephone Encounter (Signed)
gave pt appt for lab,md chemo class, nutrition, called Lauren social worker left message , Systems developer for portacath

## 2013-10-28 ENCOUNTER — Other Ambulatory Visit (INDEPENDENT_AMBULATORY_CARE_PROVIDER_SITE_OTHER): Payer: Self-pay | Admitting: Surgery

## 2013-10-28 ENCOUNTER — Ambulatory Visit: Payer: Self-pay | Admitting: Nutrition

## 2013-10-28 NOTE — Progress Notes (Signed)
53 year old male diagnosed with colon cancer status post right hemicolectomy.  He is a patient of Dr. Gearldine Shown.  Past medical history includes tobacco.  Medications include ferrous sulfate.  Labs were reviewed.  Height: 6 feet 0 inch. Weight: 159.4 pounds. Usual body weight: 195 pounds. BMI: 21.62.  Patient reports he has a history of nausea and constipation.  He had a very difficult time eating.  He lost about 40 pounds from his usual body weight.  Patient is about 3 weeks status post right hemicolectomy.  He reports he is healing well.  He feels good and his appetite has improved.  He is now eating normally and denies constipation.  Nutrition diagnosis: Food and nutrition related knowledge deficit related to new diagnosis of colon cancer and associated treatments as evidenced by no prior need for nutrition related information.  Intervention: Patient educated to consume smaller, more frequent meals and snacks throughout the day.  Educated patient on sources of protein that he could include frequently.  Assisted him with some menu suggestions.  Provided fact sheets on ways to increase calories and protein.  Recommended patient begin oral nutrition supplements twice a day.  Provided samples of both Ensure Plus and boost plus in a variety of flavors for patient to try.  Patient is appreciative of samples and nutrition information.  Questions were answered.  Teach back method used.  Contact information was given.  Monitoring, evaluation, goals: Patient will tolerate increased oral intake to continue healing and promote repletion of lean body mass.    Next Visit:  Followup to be completed with patient during upcoming chemotherapy treatments.

## 2013-10-29 ENCOUNTER — Encounter: Payer: Self-pay | Admitting: *Deleted

## 2013-10-29 NOTE — Progress Notes (Signed)
[place] Work  Clinical Social Work was referred by patient navigator for assessment of psychosocial needs.  Clinical Social Worker met with patient in office at [facility] to offer support and assess for needs.  Pt was recently diagnosed with colon cancer and was referred to CSW for information on available resources.  Pt stated he was doing "ok", but will know more information after Chemo Education on 5/13.  CSW informed pt of the support team and support services at [facility].  Pt is uninsured, but stated he applied for Medicaid while in the hospital.  Pt currently lives in [place] and is being transported to his appointments by his son and daughter.  CSW and pt discussed transportation resources.  Pt stated he is familiar with the Bus system, and while he is still physically able he is comfortable using the Bus.  CSW and pt also discussed SCAT for when he begins treatment and using the bus system may become difficult.  Pt and CSW reviewed and completed SCAT application.  CSW will submit application once the necessary information is obtained.  CSW provided contact information and encouraged pt to call with any questions or concerns.    Johnnye Lana, MSW, [place] Worker [facility] [phone]

## 2013-10-30 ENCOUNTER — Other Ambulatory Visit: Payer: Self-pay

## 2013-10-30 ENCOUNTER — Telehealth: Payer: Self-pay | Admitting: *Deleted

## 2013-10-30 ENCOUNTER — Encounter (HOSPITAL_COMMUNITY): Payer: Self-pay

## 2013-10-30 ENCOUNTER — Ambulatory Visit (HOSPITAL_COMMUNITY)
Admission: RE | Admit: 2013-10-30 | Discharge: 2013-10-30 | Disposition: A | Discharge status: Home / Self Care | Payer: Medicaid Other | Source: Ambulatory Visit | Attending: Oncology | Admitting: Oncology

## 2013-10-30 ENCOUNTER — Encounter: Payer: Self-pay | Admitting: *Deleted

## 2013-10-30 ENCOUNTER — Other Ambulatory Visit (INDEPENDENT_AMBULATORY_CARE_PROVIDER_SITE_OTHER): Payer: Self-pay | Admitting: Surgery

## 2013-10-30 DIAGNOSIS — I7090 Unspecified atherosclerosis: Secondary | ICD-10-CM | POA: Insufficient documentation

## 2013-10-30 DIAGNOSIS — Z9049 Acquired absence of other specified parts of digestive tract: Secondary | ICD-10-CM | POA: Insufficient documentation

## 2013-10-30 DIAGNOSIS — R911 Solitary pulmonary nodule: Secondary | ICD-10-CM | POA: Insufficient documentation

## 2013-10-30 DIAGNOSIS — C189 Malignant neoplasm of colon, unspecified: Secondary | ICD-10-CM | POA: Insufficient documentation

## 2013-10-30 DIAGNOSIS — I2510 Atherosclerotic heart disease of native coronary artery without angina pectoris: Secondary | ICD-10-CM | POA: Insufficient documentation

## 2013-10-30 HISTORY — DX: Malignant (primary) neoplasm, unspecified: C80.1

## 2013-10-30 MED ORDER — IOHEXOL 300 MG/ML  SOLN
80.0000 mL | Freq: Once | INTRAMUSCULAR | Status: AC | PRN
  Administered 2013-10-30: 80 mL via INTRAVENOUS

## 2013-10-30 NOTE — Telephone Encounter (Signed)
Request sent to pathology per order from Dr. Benay Spice to perform KRAS testing on Accession: [id number].

## 2013-10-31 ENCOUNTER — Other Ambulatory Visit (HOSPITAL_COMMUNITY)
Admission: RE | Admit: 2013-10-31 | Discharge: 2013-10-31 | Disposition: A | Discharge status: Home / Self Care | Payer: Medicaid Other | Source: Ambulatory Visit | Attending: Oncology | Admitting: Oncology

## 2013-10-31 ENCOUNTER — Other Ambulatory Visit (INDEPENDENT_AMBULATORY_CARE_PROVIDER_SITE_OTHER): Payer: Self-pay | Admitting: Surgery

## 2013-10-31 DIAGNOSIS — C189 Malignant neoplasm of colon, unspecified: Secondary | ICD-10-CM | POA: Diagnosis present

## 2013-10-31 DIAGNOSIS — C786 Secondary malignant neoplasm of retroperitoneum and peritoneum: Secondary | ICD-10-CM | POA: Insufficient documentation

## 2013-10-31 DIAGNOSIS — C180 Malignant neoplasm of cecum: Secondary | ICD-10-CM | POA: Insufficient documentation

## 2013-10-31 DIAGNOSIS — C50912 Malignant neoplasm of unspecified site of left female breast: Secondary | ICD-10-CM

## 2013-11-01 ENCOUNTER — Telehealth (INDEPENDENT_AMBULATORY_CARE_PROVIDER_SITE_OTHER): Payer: Self-pay | Admitting: General Surgery

## 2013-11-01 ENCOUNTER — Telehealth: Payer: Self-pay | Admitting: *Deleted

## 2013-11-01 NOTE — Telephone Encounter (Signed)
Called patient back and told him that he will need to go to [place] to have his port put in and he will need to have someone to drive him there and back, and he is aware. And he asked about the 11-05-2013 apt at the hospital and I told him that he need to go to there, that IR have to do the port and he understood that

## 2013-11-01 NOTE — Telephone Encounter (Signed)
Spoke with patient by phone and reviewed his schedule.  Answered his questions regarding treatment plan and upcoming port placement.  He verbalized understanding and knows to contact this RN for any questions or for coordination of care.

## 2013-11-05 ENCOUNTER — Other Ambulatory Visit (HOSPITAL_COMMUNITY): Payer: Self-pay

## 2013-11-05 ENCOUNTER — Other Ambulatory Visit: Payer: Self-pay | Admitting: Nurse Practitioner

## 2013-11-05 ENCOUNTER — Encounter (HOSPITAL_COMMUNITY): Payer: Self-pay | Admitting: Pharmacy Technician

## 2013-11-05 DIAGNOSIS — C189 Malignant neoplasm of colon, unspecified: Secondary | ICD-10-CM

## 2013-11-06 ENCOUNTER — Ambulatory Visit (HOSPITAL_BASED_OUTPATIENT_CLINIC_OR_DEPARTMENT_OTHER): Payer: Medicaid Other | Admitting: Nurse Practitioner

## 2013-11-06 ENCOUNTER — Telehealth: Payer: Self-pay | Admitting: Oncology

## 2013-11-06 ENCOUNTER — Other Ambulatory Visit (HOSPITAL_BASED_OUTPATIENT_CLINIC_OR_DEPARTMENT_OTHER): Payer: Medicaid Other

## 2013-11-06 ENCOUNTER — Ambulatory Visit: Admit: 2013-11-06 | Payer: Self-pay | Admitting: Surgery

## 2013-11-06 ENCOUNTER — Telehealth: Payer: Self-pay | Admitting: *Deleted

## 2013-11-06 ENCOUNTER — Other Ambulatory Visit: Payer: Self-pay | Admitting: Radiology

## 2013-11-06 VITALS — BP 122/82 | HR 58 | Temp 97.9°F | Resp 19 | Ht 72.0 in | Wt 167.7 lb

## 2013-11-06 DIAGNOSIS — C180 Malignant neoplasm of cecum: Secondary | ICD-10-CM

## 2013-11-06 DIAGNOSIS — C772 Secondary and unspecified malignant neoplasm of intra-abdominal lymph nodes: Secondary | ICD-10-CM

## 2013-11-06 DIAGNOSIS — I708 Atherosclerosis of other arteries: Secondary | ICD-10-CM

## 2013-11-06 DIAGNOSIS — R911 Solitary pulmonary nodule: Secondary | ICD-10-CM

## 2013-11-06 DIAGNOSIS — C189 Malignant neoplasm of colon, unspecified: Secondary | ICD-10-CM

## 2013-11-06 DIAGNOSIS — D509 Iron deficiency anemia, unspecified: Secondary | ICD-10-CM

## 2013-11-06 LAB — CBC WITH DIFFERENTIAL/PLATELET
BASO%: 0.5 % (ref 0.0–2.0)
Basophils Absolute: 0 10*3/uL (ref 0.0–0.1)
EOS ABS: 0.3 10*3/uL (ref 0.0–0.5)
EOS%: 4.2 % (ref 0.0–7.0)
HCT: 30.9 % — ABNORMAL LOW (ref 38.4–49.9)
HGB: 9.6 g/dL — ABNORMAL LOW (ref 13.0–17.1)
LYMPH%: 34 % (ref 14.0–49.0)
MCH: 22 pg — ABNORMAL LOW (ref 27.2–33.4)
MCHC: 31.1 g/dL — ABNORMAL LOW (ref 32.0–36.0)
MCV: 70.5 fL — AB (ref 79.3–98.0)
MONO#: 0.5 10*3/uL (ref 0.1–0.9)
MONO%: 8.4 % (ref 0.0–14.0)
NEUT%: 52.9 % (ref 39.0–75.0)
NEUTROS ABS: 3.4 10*3/uL (ref 1.5–6.5)
Platelets: 293 10*3/uL (ref 140–400)
RBC: 4.38 10*6/uL (ref 4.20–5.82)
RDW: 34.9 % — AB (ref 11.0–14.6)
WBC: 6.5 10*3/uL (ref 4.0–10.3)
lymph#: 2.2 10*3/uL (ref 0.9–3.3)

## 2013-11-06 LAB — COMPREHENSIVE METABOLIC PANEL (CC13)
ALBUMIN: 3.5 g/dL (ref 3.5–5.0)
ALT: 23 U/L (ref 0–55)
AST: 19 U/L (ref 5–34)
Alkaline Phosphatase: 66 U/L (ref 40–150)
Anion Gap: 9 mEq/L (ref 3–11)
BUN: 10.5 mg/dL (ref 7.0–26.0)
CALCIUM: 9 mg/dL (ref 8.4–10.4)
CHLORIDE: 108 meq/L (ref 98–109)
CO2: 23 mEq/L (ref 22–29)
CREATININE: 0.8 mg/dL (ref 0.7–1.3)
GLUCOSE: 84 mg/dL (ref 70–140)
POTASSIUM: 3.8 meq/L (ref 3.5–5.1)
Sodium: 140 mEq/L (ref 136–145)
Total Bilirubin: 0.36 mg/dL (ref 0.20–1.20)
Total Protein: 6.5 g/dL (ref 6.4–8.3)

## 2013-11-06 SURGERY — INSERTION, TUNNELED CENTRAL VENOUS DEVICE, WITH PORT
Anesthesia: General | Laterality: Left

## 2013-11-06 MED ORDER — PROCHLORPERAZINE MALEATE 10 MG PO TABS: 10.0000 mg | ORAL_TABLET | Freq: Four times a day (QID) | ORAL | Status: DC | PRN

## 2013-11-06 NOTE — Telephone Encounter (Signed)
Per staff message and POF I have scheduled appts. Advised scheduler to move lab appts.  JMW  

## 2013-11-06 NOTE — Telephone Encounter (Signed)
Gave pt appt for lab,md and chemo for June  and July 2015 °

## 2013-11-06 NOTE — Progress Notes (Addendum)
  [facility] OFFICE PROGRESS NOTE   Diagnosis:  Colon cancer.  INTERVAL HISTORY:   Mr. Rick Davenport returns as scheduled. He feels well. He has no complaints. No abdominal pain. He has a good appetite. No nausea or vomiting. Bowels moving regularly. No fever, cough or shortness of breath. No urinary complaints. No numbness or tingling in the hands or feet.  Objective:  Vital signs in last 24 hours:  Blood pressure 122/82, pulse 58, temperature 97.9 F (36.6 C), temperature source Oral, resp. rate 19, height 6' (1.829 m), weight 167 lb 11.2 oz (76.068 kg).    HEENT: No thrush or ulcerations. Resp: Lungs clear. Cardio: Regular cardiac rhythm. GI:  Abdomen soft and nontender. No hepatomegaly. Vascular: No leg edema. Calves soft and nontender. Neuro: Vibratory sense mildly decreased over the fingertips per tuning fork exam.  Skin: No skin rash.    Lab Results:  Lab Results  Component Value Date   WBC 6.5 11/06/2013   HGB 9.6* 11/06/2013   HCT 30.9* 11/06/2013   MCV 70.5* 11/06/2013   PLT 293 11/06/2013   NEUTROABS 3.4 11/06/2013    Imaging:  No results found.  Medications: I have reviewed the patient's current medications.  Assessment/Plan: 1. Clinical stage IV (T4, N2b,M1a) adenocarcinoma of the cecum, status post a right colectomy 10/04/2013. No loss of mismatch repair protein expression, microsatellite stable  Omental nodule resected at the time of surgery 10/04/2013 with the pathology confirming a lymph node metastasis. Chest CT 10/30/2013 with no definite signs of metastatic disease. 4 mm pulmonary nodule lateral segment of the right middle lobe. 2. Microcytic anemia-likely iron deficiency anemia. He continues oral iron. 3. History of tobacco use. 4. 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 Oneida Alar.   Disposition: Mr. Rick Davenport appears stable. He has attended the chemotherapy education class. He had no followup questions. He  is scheduled for Port-A-Cath placement on 11/13/2013. We will schedule him for cycle 1 FOLFOX on 11/18/2013. We will see him in followup prior to cycle 2 on 12/02/2013. He will contact the office in the interim with any problems.  Patient seen with Dr. Benay Spice.    Owens Shark ANP/GNP-BC   11/06/2013  11:43 AM   This was a shared visit with [name]. His case was presented at the GI tumor conference 10/30/2013. The omental mass resected at the time of surgery 10/04/2013 indicates metastatic disease.  He currently has no measurable disease.he understands the high likelihood of developing progressive metastatic colon cancer. We decided to proceed with "adjuvant" FOLFOX chemotherapy. He is scheduled for placement of a Port-A-Cath 11/13/2013 and he will begin chemotherapy 11/18/2013.  Julieanne Manson, M.D.

## 2013-11-08 ENCOUNTER — Other Ambulatory Visit: Payer: Self-pay | Admitting: Radiology

## 2013-11-08 ENCOUNTER — Encounter (HOSPITAL_COMMUNITY): Payer: Self-pay

## 2013-11-11 ENCOUNTER — Other Ambulatory Visit: Payer: Self-pay | Admitting: Oncology

## 2013-11-12 ENCOUNTER — Other Ambulatory Visit: Payer: Self-pay | Admitting: Radiology

## 2013-11-13 ENCOUNTER — Other Ambulatory Visit (INDEPENDENT_AMBULATORY_CARE_PROVIDER_SITE_OTHER): Payer: Self-pay | Admitting: Surgery

## 2013-11-13 ENCOUNTER — Ambulatory Visit (HOSPITAL_COMMUNITY)
Admission: RE | Admit: 2013-11-13 | Discharge: 2013-11-13 | Disposition: A | Discharge status: Home / Self Care | Payer: Medicaid Other | Source: Ambulatory Visit | Attending: Surgery | Admitting: Surgery

## 2013-11-13 ENCOUNTER — Encounter: Payer: Self-pay | Admitting: Vascular Surgery

## 2013-11-13 ENCOUNTER — Encounter (HOSPITAL_COMMUNITY): Payer: Self-pay

## 2013-11-13 DIAGNOSIS — C50912 Malignant neoplasm of unspecified site of left female breast: Secondary | ICD-10-CM

## 2013-11-13 DIAGNOSIS — F17200 Nicotine dependence, unspecified, uncomplicated: Secondary | ICD-10-CM | POA: Insufficient documentation

## 2013-11-13 DIAGNOSIS — Z9049 Acquired absence of other specified parts of digestive tract: Secondary | ICD-10-CM | POA: Insufficient documentation

## 2013-11-13 DIAGNOSIS — C180 Malignant neoplasm of cecum: Secondary | ICD-10-CM | POA: Insufficient documentation

## 2013-11-13 DIAGNOSIS — Z79899 Other long term (current) drug therapy: Secondary | ICD-10-CM | POA: Insufficient documentation

## 2013-11-13 LAB — CBC WITH DIFFERENTIAL/PLATELET
Basophils Absolute: 0.1 10*3/uL (ref 0.0–0.1)
Basophils Relative: 1 % (ref 0–1)
EOS PCT: 3 % (ref 0–5)
Eosinophils Absolute: 0.2 10*3/uL (ref 0.0–0.7)
HEMATOCRIT: 32.3 % — AB (ref 39.0–52.0)
Hemoglobin: 9.8 g/dL — ABNORMAL LOW (ref 13.0–17.0)
LYMPHS PCT: 37 % (ref 12–46)
Lymphs Abs: 2.1 10*3/uL (ref 0.7–4.0)
MCH: 22.3 pg — ABNORMAL LOW (ref 26.0–34.0)
MCHC: 30.3 g/dL (ref 30.0–36.0)
MCV: 73.6 fL — ABNORMAL LOW (ref 78.0–100.0)
MONOS PCT: 8 % (ref 3–12)
Monocytes Absolute: 0.4 10*3/uL (ref 0.1–1.0)
Neutro Abs: 2.8 10*3/uL (ref 1.7–7.7)
Neutrophils Relative %: 51 % (ref 43–77)
PLATELETS: 223 10*3/uL (ref 150–400)
RBC: 4.39 MIL/uL (ref 4.22–5.81)
RDW: 30.6 % — AB (ref 11.5–15.5)
WBC: 5.6 10*3/uL (ref 4.0–10.5)

## 2013-11-13 LAB — PROTIME-INR
INR: 0.9 (ref 0.00–1.49)
PROTHROMBIN TIME: 12 s (ref 11.6–15.2)

## 2013-11-13 LAB — APTT: APTT: 29 s (ref 24–37)

## 2013-11-13 MED ORDER — LIDOCAINE HCL 1 % IJ SOLN
INTRAMUSCULAR | Status: AC
  Filled 2013-11-13: qty 20

## 2013-11-13 MED ORDER — CEFAZOLIN SODIUM-DEXTROSE 2-3 GM-% IV SOLR
2.0000 g | Freq: Once | INTRAVENOUS | Status: AC
  Administered 2013-11-13: 2 g via INTRAVENOUS
  Filled 2013-11-13: qty 50

## 2013-11-13 MED ORDER — FENTANYL CITRATE 0.05 MG/ML IJ SOLN
INTRAMUSCULAR | Status: AC
  Filled 2013-11-13: qty 6

## 2013-11-13 MED ORDER — FENTANYL CITRATE 0.05 MG/ML IJ SOLN
INTRAMUSCULAR | Status: AC | PRN
  Administered 2013-11-13 (×4): 25 ug via INTRAVENOUS

## 2013-11-13 MED ORDER — MIDAZOLAM HCL 2 MG/2ML IJ SOLN
INTRAMUSCULAR | Status: AC | PRN
  Administered 2013-11-13 (×4): 1 mg via INTRAVENOUS

## 2013-11-13 MED ORDER — LIDOCAINE-EPINEPHRINE (PF) 2 %-1:200000 IJ SOLN
INTRAMUSCULAR | Status: AC
  Filled 2013-11-13: qty 20

## 2013-11-13 MED ORDER — MIDAZOLAM HCL 2 MG/2ML IJ SOLN
INTRAMUSCULAR | Status: AC
  Filled 2013-11-13: qty 6

## 2013-11-13 MED ORDER — HEPARIN SOD (PORK) LOCK FLUSH 100 UNIT/ML IV SOLN
INTRAVENOUS | Status: AC
  Filled 2013-11-13: qty 5

## 2013-11-13 MED ORDER — SODIUM CHLORIDE 0.9 % IV SOLN
INTRAVENOUS | Status: DC
  Administered 2013-11-13: 10:00:00 via INTRAVENOUS

## 2013-11-13 MED ORDER — HEPARIN SOD (PORK) LOCK FLUSH 100 UNIT/ML IV SOLN
INTRAVENOUS | Status: AC | PRN
  Administered 2013-11-13: 500 [IU]

## 2013-11-13 NOTE — Procedures (Signed)
Successful RT IJ POWER PORT TIP SVC/RA NO COMP STABLE READY FOR USE FULL REPORT IN PACS  

## 2013-11-13 NOTE — H&P (Signed)
Chief Complaint: "I am here for a port." Referring Physician: Dr. Georgette Dover HPI: Rick Davenport is an 53 y.o. male with adenocarcinoma of the cecum s/p partial colectomy. He is scheduled to start chemotherapy 11/18/13 and is scheduled today for port a catheter placement. He denies any chest pain, shortness of breath or palpitations. He denies any active signs of bleeding or excessive bruising. He denies any recent fever or chills. The patient denies any history of sleep apnea or chronic oxygen use. He has previously tolerated sedation without complications during a colonoscopy.    Past Medical History:  Past Medical History  Diagnosis Date  . Cancer 09/2013    colon ca    Past Surgical History:  Past Surgical History  Procedure Laterality Date  . Partial colectomy Right 10/04/2013    Procedure: RIGHT HEMICOLECTOMY ;  Surgeon: Imogene Burn. Georgette Dover, MD;  Location: [place] [facility] OR;  Service: General;  Laterality: Right;    Family History:  Family History  Problem Relation Age of Onset  . Diabetes Mother   . Lung cancer Father   . Prostate cancer Brother     Oldest brother    Social History:  reports that he has been smoking Cigarettes.  He has a 40 pack-year smoking history. He does not have any smokeless tobacco history on file. He reports that he drinks about .6 ounces of alcohol per week. He reports that he does not use illicit drugs.  Allergies: No Known Allergies  Medications:   Medication List    ASK your doctor about these medications       ferrous sulfate 325 (65 FE) MG EC tablet  Take 1 tablet (325 mg total) by mouth 2 (two) times daily with a meal.     prochlorperazine 10 MG tablet  Commonly known as:  COMPAZINE  Take 1 tablet (10 mg total) by mouth every 6 (six) hours as needed for nausea or vomiting.       Please HPI for pertinent positives, otherwise complete 10 system ROS negative.  Physical Exam: BP 104/70  Pulse 56  Temp(Src) 97.7 F (36.5 C) (Oral)  Resp 18  Ht 6' (1.829  m)  Wt 167 lb (75.751 kg)  BMI 22.64 kg/m2  SpO2 100% Body mass index is 22.64 kg/(m^2).  General Appearance:  Alert, cooperative, no distress  Head:  Normocephalic, without obvious abnormality, atraumatic  Neck: Supple, symmetrical, trachea midline  Lungs:   Clear to auscultation bilaterally, no w/r/r, respirations unlabored without use of accessory muscles.  Chest Wall:  No tenderness or deformity  Heart:  Regular rate and rhythm, S1, S2 normal, no murmur, rub or gallop.  Abdomen:   Soft, non-tender, non distended, (+) BS  Extremities: Extremities normal, atraumatic, no cyanosis or edema  Neurologic: Normal affect, no gross deficits.   No results found for this or any previous visit (from the past 48 hour(s)). No results found.  Assessment/Plan Adenocarcinoma of cecum s/p partial colectomy.  Request for image guided port a catheter placement for chemotherapy. Patient has been NPO, no blood thinners taken, labs reviewed. Risks and Benefits discussed with the patient. All of the patient's questions were answered, patient is agreeable to proceed. Consent signed and in chart.   Hedy Jacob PA-C 11/13/2013, 10:36 AM

## 2013-11-13 NOTE — Discharge Instructions (Signed)
Implanted Port Home Guide °An implanted port is a type of central line that is placed under the skin. Central lines are used to provide IV access when treatment or nutrition needs to be given through a person's veins. Implanted ports are used for long-term IV access. An implanted port may be placed because:  °· You need IV medicine that would be irritating to the small veins in your hands or arms.   °· You need long-term IV medicines, such as antibiotics.   °· You need IV nutrition for a long period.   °· You need frequent blood draws for lab tests.   °· You need dialysis.   °Implanted ports are usually placed in the chest area, but they can also be placed in the upper arm, the abdomen, or the leg. An implanted port has two main parts:  °· Reservoir. The reservoir is round and will appear as a small, raised area under your skin. The reservoir is the part where a needle is inserted to give medicines or draw blood.   °· Catheter. The catheter is a thin, flexible tube that extends from the reservoir. The catheter is placed into a large vein. Medicine that is inserted into the reservoir goes into the catheter and then into the vein.   °HOW WILL I CARE FOR MY INCISION SITE? °Do not get the incision site wet. Bathe or shower as directed by your health care provider.  °HOW IS MY PORT ACCESSED? °Special steps must be taken to access the port:  °· Before the port is accessed, a numbing cream can be placed on the skin. This helps numb the skin over the port site.   °· Your health care provider uses a sterile technique to access the port. °· Your health care provider must put on a mask and sterile gloves. °· The skin over your port is cleaned carefully with an antiseptic and allowed to dry. °· The port is gently pinched between sterile gloves, and a needle is inserted into the port. °· Only "non-coring" port needles should be used to access the port. Once the port is accessed, a blood return should be checked. This helps  ensure that the port is in the vein and is not clogged.   °· If your port needs to remain accessed for a constant infusion, a clear (transparent) bandage will be placed over the needle site. The bandage and needle will need to be changed every week, or as directed by your health care provider.   °· Keep the bandage covering the needle clean and dry. Do not get it wet. Follow your health care provider's instructions on how to take a shower or bath while the port is accessed.   °· If your port does not need to stay accessed, no bandage is needed over the port.   °WHAT IS FLUSHING? °Flushing helps keep the port from getting clogged. Follow your health care provider's instructions on how and when to flush the port. Ports are usually flushed with saline solution or a medicine called heparin. The need for flushing will depend on how the port is used.  °· If the port is used for intermittent medicines or blood draws, the port will need to be flushed:   °· After medicines have been given.   °· After blood has been drawn.   °· As part of routine maintenance.   °· If a constant infusion is running, the port may not need to be flushed.   °HOW LONG WILL MY PORT STAY IMPLANTED? °The port can stay in for as long as your health care   provider thinks it is needed. When it is time for the port to come out, surgery will be done to remove it. The procedure is similar to the one performed when the port was put in.  °WHEN SHOULD I SEEK IMMEDIATE MEDICAL CARE? °When you have an implanted port, you should seek immediate medical care if:  °· You notice a bad smell coming from the incision site.   °· You have swelling, redness, or drainage at the incision site.   °· You have more swelling or pain at the port site or the surrounding area.   °· You have a fever that is not controlled with medicine. °Document Released: 06/06/2005 Document Revised: 03/27/2013 Document Reviewed: 02/11/2013 °[employer]® Patient Information ©2014 [employer]. °Moderate Sedation, Adult °Moderate sedation is given to help you relax or even sleep through a procedure. You may remain sleepy, be clumsy, or have poor balance for several hours following this procedure. Arrange for a responsible adult, family member, or friend to take you home. A responsible adult should stay with you for at least 24 hours or until the medicines have worn off. °· Do not participate in any activities where you could become injured for the next 24 hours, or until you feel normal again. Do not: °· Drive. °· Swim. °· Ride a bicycle. °· Operate heavy machinery. °· Cook. °· Use power tools. °· Climb ladders. °· Work at heights. °· Do not make important decisions or sign legal documents until you are improved. °· Vomiting may occur if you eat too soon. When you can drink without vomiting, try water, juice, or soup. Try solid foods if you feel little or no nausea. °· Only take over-the-counter or prescription medications for pain, discomfort, or fever as directed by your caregiver.If pain medications have been prescribed for you, ask your caregiver how soon it is safe to take them. °· Make sure you and your family fully understands everything about the medication given to you. Make sure you understand what side effects may occur. °· You should not drink alcohol, take sleeping pills, or medications that cause drowsiness for at least 24 hours. °· If you smoke, do not smoke alone. °· If you are feeling better, you may resume normal activities 24 hours after receiving sedation. °· Keep all appointments as scheduled. Follow all instructions. °· Ask questions if you do not understand. °SEEK MEDICAL CARE IF:  °· Your skin is pale or bluish in color. °· You continue to feel sick to your stomach (nauseous) or throw up (vomit). °· Your pain is getting worse and not helped by medication. °· You have bleeding or swelling. °· You are still sleepy or feeling clumsy after 24 hours. °SEEK IMMEDIATE MEDICAL CARE IF:   °· You develop a rash. °· You have difficulty breathing. °· You develop any type of allergic problem. °· You have a fever. °Document Released: 03/01/2001 Document Revised: 08/29/2011 Document Reviewed: 02/11/2013 °Exit[employer]® Patient Information ©2014 [employer]. ° °

## 2013-11-14 ENCOUNTER — Ambulatory Visit (HOSPITAL_COMMUNITY)
Admission: RE | Admit: 2013-11-14 | Discharge: 2013-11-14 | Disposition: A | Discharge status: Home / Self Care | Payer: Medicaid Other | Source: Ambulatory Visit | Attending: Vascular Surgery | Admitting: Vascular Surgery

## 2013-11-14 ENCOUNTER — Encounter: Payer: Self-pay | Admitting: Oncology

## 2013-11-14 ENCOUNTER — Encounter: Payer: Self-pay | Admitting: Vascular Surgery

## 2013-11-14 ENCOUNTER — Ambulatory Visit (INDEPENDENT_AMBULATORY_CARE_PROVIDER_SITE_OTHER): Payer: Medicaid Other | Admitting: Vascular Surgery

## 2013-11-14 VITALS — BP 116/81 | HR 60 | Ht 72.0 in | Wt 177.0 lb

## 2013-11-14 DIAGNOSIS — I708 Atherosclerosis of other arteries: Secondary | ICD-10-CM | POA: Insufficient documentation

## 2013-11-14 DIAGNOSIS — I771 Stricture of artery: Secondary | ICD-10-CM

## 2013-11-14 NOTE — Progress Notes (Signed)
[facility] HISTORY AND PHYSICAL   History of Present Illness:  Patient is a 53 y.o. 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 The patient was seen in the hospital after a colon resection. 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 He had minimal symptoms at that point. He continues to deny any claudication symptoms. He has no rest pain. He has no nonhealing wounds..  Other medical problems include colon cancer with omental metastasis currently stable and followed by Dr. Benay Spice.  Past Medical History  Diagnosis Date  . Cancer 09/2013    colon ca    Past Surgical History  Procedure Laterality Date  . Partial colectomy Right 10/04/2013    Procedure: RIGHT HEMICOLECTOMY ;  Surgeon: Imogene Burn. Georgette Dover, MD;  Location: [facility];  Service: General;  Laterality: Right;    Social History History  Substance Use Topics  . Smoking status: Current Some Day Smoker -- 1.00 packs/day for 40 years    Types: Cigarettes  . Smokeless tobacco: Not on file     Comment: pt states that he is trying to quit smoking  . Alcohol Use: Yes     Comment: 1 can of beer per day    Family History Family History  Problem Relation Age of Onset  . Diabetes Mother   . Lung cancer Father   . Prostate cancer Brother     Oldest brother    Allergies  No Known Allergies   Current Outpatient Prescriptions  Medication Sig Dispense Refill  . ferrous sulfate 325 (65 FE) MG EC tablet Take 1 tablet (325 mg total) by mouth 2 (two) times daily with a meal.  60 tablet  3  . prochlorperazine (COMPAZINE) 10 MG tablet Take 1 tablet (10 mg total) by mouth every 6 (six) hours as needed for nausea or vomiting.  30 tablet  2   No current facility-administered medications for this visit.    ROS:   General:  No weight loss, Fever, chills  Pulmonary: No home oxygen, no productive cough, no hemoptysis,  No asthma or  wheezing  Musculoskeletal:  [ ]  Arthritis, [ ]  Low back pain,  [ ]  Joint pain   Physical Examination  Filed Vitals:   11/14/13 1148  BP: 116/81  Pulse: 60  Height: 6' (1.829 m)  Weight: 177 lb (80.287 kg)  SpO2: 100%    Body mass index is 24 kg/(m^2).  General:  Alert and oriented, no acute distress HEENT: Normal Neck: No bruit or JVD Pulmonary: Clear to auscultation bilaterally Cardiac: Regular Rate and Rhythm without murmur Skin: No rash Extremity Pulses:  2+ radial, brachial, femoral, dorsalis pedis, posterior tibial pulses bilaterally Musculoskeletal: No deformity or edema  Neurologic: Upper and lower extremity motor 5/5 and symmetric  DATA:  Patient had bilateral ABIs today with exercise. ABIs resting were 1.27 right 1.1 a left triphasic waveforms. He had normal physiologic increase of pressure after ambulating.   ASSESSMENT:  Right external iliac artery stenosis asymptomatic with no physiologic compromise of lower extremity circulation with exercise   PLAN:  Patient will followup in 6 months time with repeat ABIs or sooner if he has claudication symptoms. He will try to refrain from tobacco products.  Ruta Hinds, MD [facility] Office: [phone] Pager: [phone]

## 2013-11-14 NOTE — Progress Notes (Signed)
FOLFOX are not replaceable drugs °

## 2013-11-15 ENCOUNTER — Encounter: Payer: Self-pay | Admitting: *Deleted

## 2013-11-15 NOTE — Progress Notes (Signed)
[place] Work  Clinical Social Work assisted pt with SCAT application. CSW received call from [place] at [place] office that pt was approved for services and could begin using this resource for help getting to appointments.     Clinical Social Worker will continue to follow and assist as needed.       Clinical Social Work interventions: Resource and referral  Loren Racer, [place] Social Worker [facility] [place] for [place] Wednesday, Thursday and Friday Phone: [phone] Fax: [phone]

## 2013-11-18 ENCOUNTER — Encounter (INDEPENDENT_AMBULATORY_CARE_PROVIDER_SITE_OTHER): Payer: Self-pay | Admitting: Surgery

## 2013-11-18 ENCOUNTER — Ambulatory Visit (INDEPENDENT_AMBULATORY_CARE_PROVIDER_SITE_OTHER): Payer: Medicaid Other | Admitting: Surgery

## 2013-11-18 ENCOUNTER — Ambulatory Visit (HOSPITAL_BASED_OUTPATIENT_CLINIC_OR_DEPARTMENT_OTHER): Payer: Medicaid Other

## 2013-11-18 VITALS — BP 109/78 | HR 75 | Temp 98.1°F

## 2013-11-18 VITALS — BP 112/74 | HR 72 | Temp 97.8°F | Resp 14 | Ht 72.0 in | Wt 176.6 lb

## 2013-11-18 DIAGNOSIS — C189 Malignant neoplasm of colon, unspecified: Secondary | ICD-10-CM

## 2013-11-18 DIAGNOSIS — C772 Secondary and unspecified malignant neoplasm of intra-abdominal lymph nodes: Secondary | ICD-10-CM

## 2013-11-18 DIAGNOSIS — Z5111 Encounter for antineoplastic chemotherapy: Secondary | ICD-10-CM

## 2013-11-18 DIAGNOSIS — C180 Malignant neoplasm of cecum: Secondary | ICD-10-CM

## 2013-11-18 MED ORDER — FLUOROURACIL CHEMO INJECTION 2.5 GM/50ML
400.0000 mg/m2 | Freq: Once | INTRAVENOUS | Status: AC
  Administered 2013-11-18: 800 mg via INTRAVENOUS
  Filled 2013-11-18: qty 16

## 2013-11-18 MED ORDER — PROCHLORPERAZINE MALEATE 10 MG PO TABS: 10.0000 mg | ORAL_TABLET | Freq: Four times a day (QID) | ORAL | Status: DC | PRN

## 2013-11-18 MED ORDER — ONDANSETRON 8 MG/NS 50 ML IVPB
INTRAVENOUS | Status: AC
  Filled 2013-11-18: qty 8

## 2013-11-18 MED ORDER — LEUCOVORIN CALCIUM INJECTION 350 MG
400.0000 mg/m2 | Freq: Once | INTRAVENOUS | Status: AC
  Administered 2013-11-18: 800 mg via INTRAVENOUS
  Filled 2013-11-18: qty 40

## 2013-11-18 MED ORDER — OXALIPLATIN CHEMO INJECTION 100 MG/20ML
85.0000 mg/m2 | Freq: Once | INTRAVENOUS | Status: AC
  Administered 2013-11-18: 165 mg via INTRAVENOUS
  Filled 2013-11-18: qty 33

## 2013-11-18 MED ORDER — ONDANSETRON 8 MG/50ML IVPB (CHCC)
8.0000 mg | Freq: Once | INTRAVENOUS | Status: AC
  Administered 2013-11-18: 8 mg via INTRAVENOUS

## 2013-11-18 MED ORDER — LIDOCAINE-PRILOCAINE 2.5-2.5 % EX CREA: 1.0000 "application " | TOPICAL_CREAM | CUTANEOUS | Status: DC | PRN

## 2013-11-18 MED ORDER — DEXAMETHASONE SODIUM PHOSPHATE 10 MG/ML IJ SOLN
INTRAMUSCULAR | Status: AC
  Filled 2013-11-18: qty 1

## 2013-11-18 MED ORDER — DEXAMETHASONE SODIUM PHOSPHATE 10 MG/ML IJ SOLN
10.0000 mg | Freq: Once | INTRAMUSCULAR | Status: AC
  Administered 2013-11-18: 10 mg via INTRAVENOUS

## 2013-11-18 MED ORDER — SODIUM CHLORIDE 0.9 % IV SOLN
2400.0000 mg/m2 | INTRAVENOUS | Status: DC
  Administered 2013-11-18: 4750 mg via INTRAVENOUS
  Filled 2013-11-18: qty 95

## 2013-11-18 MED ORDER — DEXTROSE 5 % IV SOLN
Freq: Once | INTRAVENOUS | Status: AC
  Administered 2013-11-18: 12:00:00 via INTRAVENOUS

## 2013-11-18 NOTE — Progress Notes (Signed)
Patient had one episode of nausea and vomiting during the 5 FU push. Dr. Benay Spice notified. Continue push slower and educate about nausea medications. Patient verbalized understanding of usage of nausea medications.  Patient denies nausea upon discharge.

## 2013-11-18 NOTE — Progress Notes (Signed)
Patient's infusion room RN noted he did not have anti-emetic or emla cream at home. Orders obtained from [name], NP and this RN sent med orders to preferred pharmacy. Patient verbalizes understanding.

## 2013-11-18 NOTE — Patient Instructions (Signed)
[facility] Discharge Instructions for Patients Receiving Chemotherapy  Today you received the following chemotherapy agents 5 FU/Leucovorin/Oxailiplatin To help prevent nausea and vomiting after your treatment, we encourage you to take your nausea medication as prescribed.  If you develop nausea and vomiting that is not controlled by your nausea medication, call the clinic.   BELOW ARE SYMPTOMS THAT SHOULD BE REPORTED IMMEDIATELY:  *FEVER GREATER THAN 100.5 F  *CHILLS WITH OR WITHOUT FEVER  NAUSEA AND VOMITING THAT IS NOT CONTROLLED WITH YOUR NAUSEA MEDICATION  *UNUSUAL SHORTNESS OF BREATH  *UNUSUAL BRUISING OR BLEEDING  TENDERNESS IN MOUTH AND THROAT WITH OR WITHOUT PRESENCE OF ULCERS  *URINARY PROBLEMS  *BOWEL PROBLEMS  UNUSUAL RASH Items with * indicate a potential emergency and should be followed up as soon as possible.  Feel free to call the clinic you have any questions or concerns. The clinic phone number is [phone].    Fluorouracil, 5-FU injection What is this medicine? FLUOROURACIL, 5-FU (flure oh YOOR a sil) is a chemotherapy drug. It slows the growth of cancer cells. This medicine is used to treat many types of cancer like breast cancer, colon or rectal cancer, pancreatic cancer, and stomach cancer. This medicine may be used for other purposes; ask your health care provider or pharmacist if you have questions. COMMON BRAND NAME(S): Adrucil What should I tell my health care provider before I take this medicine? They need to know if you have any of these conditions: -blood disorders -dihydropyrimidine dehydrogenase (DPD) deficiency -infection (especially a virus infection such as chickenpox, cold sores, or herpes) -kidney disease -liver disease -malnourished, poor nutrition -recent or ongoing radiation therapy -an unusual or allergic reaction to fluorouracil, other chemotherapy, other medicines, foods, dyes, or preservatives -pregnant or  trying to get pregnant -breast-feeding How should I use this medicine? This drug is given as an infusion or injection into a vein. It is administered in a hospital or clinic by a specially trained health care professional. Talk to your pediatrician regarding the use of this medicine in children. Special care may be needed. Overdosage: If you think you have taken too much of this medicine contact a poison control center or emergency room at once. NOTE: This medicine is only for you. Do not share this medicine with others. What if I miss a dose? It is important not to miss your dose. Call your doctor or health care professional if you are unable to keep an appointment. What may interact with this medicine? -allopurinol -cimetidine -dapsone -digoxin -hydroxyurea -leucovorin -levamisole -medicines for seizures like ethotoin, fosphenytoin, phenytoin -medicines to increase blood counts like filgrastim, pegfilgrastim, sargramostim -medicines that treat or prevent blood clots like warfarin, enoxaparin, and dalteparin -methotrexate -metronidazole -pyrimethamine -some other chemotherapy drugs like busulfan, cisplatin, estramustine, vinblastine -trimethoprim -trimetrexate -vaccines Talk to your doctor or health care professional before taking any of these medicines: -acetaminophen -aspirin -ibuprofen -ketoprofen -naproxen This list may not describe all possible interactions. Give your health care provider a list of all the medicines, herbs, non-prescription drugs, or dietary supplements you use. Also tell them if you smoke, drink alcohol, or use illegal drugs. Some items may interact with your medicine. What should I watch for while using this medicine? Visit your doctor for checks on your progress. This drug may make you feel generally unwell. This is not uncommon, as chemotherapy can affect healthy cells as well as cancer cells. Report any side effects. Continue your course of treatment  even though you feel ill unless  your doctor tells you to stop. In some cases, you may be given additional medicines to help with side effects. Follow all directions for their use. Call your doctor or health care professional for advice if you get a fever, chills or sore throat, or other symptoms of a cold or flu. Do not treat yourself. This drug decreases your body's ability to fight infections. Try to avoid being around people who are sick. This medicine may increase your risk to bruise or bleed. Call your doctor or health care professional if you notice any unusual bleeding. Be careful brushing and flossing your teeth or using a toothpick because you may get an infection or bleed more easily. If you have any dental work done, tell your dentist you are receiving this medicine. Avoid taking products that contain aspirin, acetaminophen, ibuprofen, naproxen, or ketoprofen unless instructed by your doctor. These medicines may hide a fever. Do not become pregnant while taking this medicine. Women should inform their doctor if they wish to become pregnant or think they might be pregnant. There is a potential for serious side effects to an unborn child. Talk to your health care professional or pharmacist for more information. Do not breast-feed an infant while taking this medicine. Men should inform their doctor if they wish to father a child. This medicine may lower sperm counts. Do not treat diarrhea with over the counter products. Contact your doctor if you have diarrhea that lasts more than 2 days or if it is severe and watery. This medicine can make you more sensitive to the sun. Keep out of the sun. If you cannot avoid being in the sun, wear protective clothing and use sunscreen. Do not use sun lamps or tanning beds/booths. What side effects may I notice from receiving this medicine? Side effects that you should report to your doctor or health care professional as soon as possible: -allergic reactions  like skin rash, itching or hives, swelling of the face, lips, or tongue -low blood counts - this medicine may decrease the number of white blood cells, red blood cells and platelets. You may be at increased risk for infections and bleeding. -signs of infection - fever or chills, cough, sore throat, pain or difficulty passing urine -signs of decreased platelets or bleeding - bruising, pinpoint red spots on the skin, black, tarry stools, blood in the urine -signs of decreased red blood cells - unusually weak or tired, fainting spells, lightheadedness -breathing problems -changes in vision -chest pain -mouth sores -nausea and vomiting -pain, swelling, redness at site where injected -pain, tingling, numbness in the hands or feet -redness, swelling, or sores on hands or feet -stomach pain -unusual bleeding Side effects that usually do not require medical attention (report to your doctor or health care professional if they continue or are bothersome): -changes in finger or toe nails -diarrhea -dry or itchy skin -hair loss -headache -loss of appetite -sensitivity of eyes to the light -stomach upset -unusually teary eyes This list may not describe all possible side effects. Call your doctor for medical advice about side effects. You may report side effects to FDA at 1-800-FDA-1088. Where should I keep my medicine? This drug is given in a hospital or clinic and will not be stored at home. NOTE: This sheet is a summary. It may not cover all possible information. If you have questions about this medicine, talk to your doctor, pharmacist, or health care provider.  2014, Elsevier/Gold Standard. (2007-10-10 13:53:16)    Leucovorin injection What is this  medicine? LEUCOVORIN (loo koe VOR in) is used to prevent or treat the harmful effects of some medicines. This medicine is used to treat anemia caused by a low amount of folic acid in the body. It is also used with 5-fluorouracil (5-FU) to treat  colon cancer. This medicine may be used for other purposes; ask your health care provider or pharmacist if you have questions. What should I tell my health care provider before I take this medicine? They need to know if you have any of these conditions: -anemia from low levels of vitamin B-12 in the blood -an unusual or allergic reaction to leucovorin, folic acid, other medicines, foods, dyes, or preservatives -pregnant or trying to get pregnant -breast-feeding How should I use this medicine? This medicine is for injection into a muscle or into a vein. It is given by a health care professional in a hospital or clinic setting. Talk to your pediatrician regarding the use of this medicine in children. Special care may be needed. Overdosage: If you think you have taken too much of this medicine contact a poison control center or emergency room at once. NOTE: This medicine is only for you. Do not share this medicine with others. What if I miss a dose? This does not apply. What may interact with this medicine? -capecitabine -fluorouracil -phenobarbital -phenytoin -primidone -trimethoprim-sulfamethoxazole This list may not describe all possible interactions. Give your health care provider a list of all the medicines, herbs, non-prescription drugs, or dietary supplements you use. Also tell them if you smoke, drink alcohol, or use illegal drugs. Some items may interact with your medicine. What should I watch for while using this medicine? Your condition will be monitored carefully while you are receiving this medicine. This medicine may increase the side effects of 5-fluorouracil, 5-FU. Tell your doctor or health care professional if you have diarrhea or mouth sores that do not get better or that get worse. What side effects may I notice from receiving this medicine? Side effects that you should report to your doctor or health care professional as soon as possible: -allergic reactions like skin  rash, itching or hives, swelling of the face, lips, or tongue -breathing problems -fever, infection -mouth sores -unusual bleeding or bruising -unusually weak or tired Side effects that usually do not require medical attention (report to your doctor or health care professional if they continue or are bothersome): -constipation or diarrhea -loss of appetite -nausea, vomiting This list may not describe all possible side effects. Call your doctor for medical advice about side effects. You may report side effects to FDA at 1-800-FDA-1088. Where should I keep my medicine? This drug is given in a hospital or clinic and will not be stored at home. NOTE: This sheet is a summary. It may not cover all possible information. If you have questions about this medicine, talk to your doctor, pharmacist, or health care provider.  2014, Elsevier/Gold Standard. (2007-12-11 16:50:29)    Oxaliplatin Injection What is this medicine? OXALIPLATIN (ox AL i PLA tin) is a chemotherapy drug. It targets fast dividing cells, like cancer cells, and causes these cells to die. This medicine is used to treat cancers of the colon and rectum, and many other cancers. This medicine may be used for other purposes; ask your health care provider or pharmacist if you have questions. COMMON BRAND NAME(S): Eloxatin What should I tell my health care provider before I take this medicine? They need to know if you have any of these conditions: -kidney disease -  an unusual or allergic reaction to oxaliplatin, other chemotherapy, other medicines, foods, dyes, or preservatives -pregnant or trying to get pregnant -breast-feeding How should I use this medicine? This drug is given as an infusion into a vein. It is administered in a hospital or clinic by a specially trained health care professional. Talk to your pediatrician regarding the use of this medicine in children. Special care may be needed. Overdosage: If you think you have taken  too much of this medicine contact a poison control center or emergency room at once. NOTE: This medicine is only for you. Do not share this medicine with others. What if I miss a dose? It is important not to miss a dose. Call your doctor or health care professional if you are unable to keep an appointment. What may interact with this medicine? -medicines to increase blood counts like filgrastim, pegfilgrastim, sargramostim -probenecid -some antibiotics like amikacin, gentamicin, neomycin, polymyxin B, streptomycin, tobramycin -zalcitabine Talk to your doctor or health care professional before taking any of these medicines: -acetaminophen -aspirin -ibuprofen -ketoprofen -naproxen This list may not describe all possible interactions. Give your health care provider a list of all the medicines, herbs, non-prescription drugs, or dietary supplements you use. Also tell them if you smoke, drink alcohol, or use illegal drugs. Some items may interact with your medicine. What should I watch for while using this medicine? Your condition will be monitored carefully while you are receiving this medicine. You will need important blood work done while you are taking this medicine. This medicine can make you more sensitive to cold. Do not drink cold drinks or use ice. Cover exposed skin before coming in contact with cold temperatures or cold objects. When out in cold weather wear warm clothing and cover your mouth and nose to warm the air that goes into your lungs. Tell your doctor if you get sensitive to the cold. This drug may make you feel generally unwell. This is not uncommon, as chemotherapy can affect healthy cells as well as cancer cells. Report any side effects. Continue your course of treatment even though you feel ill unless your doctor tells you to stop. In some cases, you may be given additional medicines to help with side effects. Follow all directions for their use. Call your doctor or health care  professional for advice if you get a fever, chills or sore throat, or other symptoms of a cold or flu. Do not treat yourself. This drug decreases your body's ability to fight infections. Try to avoid being around people who are sick. This medicine may increase your risk to bruise or bleed. Call your doctor or health care professional if you notice any unusual bleeding. Be careful brushing and flossing your teeth or using a toothpick because you may get an infection or bleed more easily. If you have any dental work done, tell your dentist you are receiving this medicine. Avoid taking products that contain aspirin, acetaminophen, ibuprofen, naproxen, or ketoprofen unless instructed by your doctor. These medicines may hide a fever. Do not become pregnant while taking this medicine. Women should inform their doctor if they wish to become pregnant or think they might be pregnant. There is a potential for serious side effects to an unborn child. Talk to your health care professional or pharmacist for more information. Do not breast-feed an infant while taking this medicine. Call your doctor or health care professional if you get diarrhea. Do not treat yourself. What side effects may I notice from receiving this  medicine? Side effects that you should report to your doctor or health care professional as soon as possible: -allergic reactions like skin rash, itching or hives, swelling of the face, lips, or tongue -low blood counts - This drug may decrease the number of white blood cells, red blood cells and platelets. You may be at increased risk for infections and bleeding. -signs of infection - fever or chills, cough, sore throat, pain or difficulty passing urine -signs of decreased platelets or bleeding - bruising, pinpoint red spots on the skin, black, tarry stools, nosebleeds -signs of decreased red blood cells - unusually weak or tired, fainting spells, lightheadedness -breathing problems -chest pain,  pressure -cough -diarrhea -jaw tightness -mouth sores -nausea and vomiting -pain, swelling, redness or irritation at the injection site -pain, tingling, numbness in the hands or feet -problems with balance, talking, walking -redness, blistering, peeling or loosening of the skin, including inside the mouth -trouble passing urine or change in the amount of urine Side effects that usually do not require medical attention (report to your doctor or health care professional if they continue or are bothersome): -changes in vision -constipation -hair loss -loss of appetite -metallic taste in the mouth or changes in taste -stomach pain This list may not describe all possible side effects. Call your doctor for medical advice about side effects. You may report side effects to FDA at 1-800-FDA-1088. Where should I keep my medicine? This drug is given in a hospital or clinic and will not be stored at home. NOTE: This sheet is a summary. It may not cover all possible information. If you have questions about this medicine, talk to your doctor, pharmacist, or health care provider.  2014, Elsevier/Gold Standard. (2008-01-01 17:22:47)

## 2013-11-18 NOTE — Progress Notes (Signed)
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  Surgery was performed urgently on 10/04/13.  The patient is doing quite well with normal energy and normal appetite. A port was placed last week. He is scheduled to begin chemotherapy today. The patient feels like he is feeling much better than before surgery. His abdomen is soft and nontender. His incision is well-healed with no sign of infection or hernia.  We will release the patient to Dr. Carin Hock care. We will be glad to see him back as needed.  Imogene Burn. Georgette Dover, MD, [facility]  General/ Trauma Surgery  11/18/2013 10:08 AM

## 2013-11-19 ENCOUNTER — Telehealth: Payer: Self-pay | Admitting: *Deleted

## 2013-11-19 NOTE — Telephone Encounter (Signed)
Message copied by Cherylynn Ridges on Tue Nov 19, 2013  4:11 PM ------      Message from: Perlie Gold      Created: Mon Nov 18, 2013  1:04 PM      Regarding: Chemo follow up call      Contact: [phone]       First time FOLFOX. Dr. Benay Spice patient. Please call.            Thanks,            Barnetta Chapel, RN ------

## 2013-11-19 NOTE — Telephone Encounter (Signed)
[facility] for chemotherapy F/U.  Patient is doing well.  Denies n/v.  Denies any new side effects or symptoms.  Bowel and bladder is functioning well.  Eating and drinking well and I instructed to drink 64 oz minimum daily or at least the day before, of and after treatment.  Denies questions at this time and encouraged to call if needed.  Reviewed how to call after hours in the case of an emergency.

## 2013-11-20 ENCOUNTER — Ambulatory Visit (HOSPITAL_BASED_OUTPATIENT_CLINIC_OR_DEPARTMENT_OTHER): Payer: Medicaid Other

## 2013-11-20 VITALS — BP 118/79 | HR 62

## 2013-11-20 DIAGNOSIS — C772 Secondary and unspecified malignant neoplasm of intra-abdominal lymph nodes: Secondary | ICD-10-CM

## 2013-11-20 DIAGNOSIS — C180 Malignant neoplasm of cecum: Secondary | ICD-10-CM

## 2013-11-20 MED ORDER — SODIUM CHLORIDE 0.9 % IJ SOLN
10.0000 mL | INTRAMUSCULAR | Status: DC | PRN
  Administered 2013-11-20: 10 mL
  Filled 2013-11-20: qty 10

## 2013-11-20 MED ORDER — HEPARIN SOD (PORK) LOCK FLUSH 100 UNIT/ML IV SOLN
500.0000 [IU] | Freq: Once | INTRAVENOUS | Status: AC | PRN
  Administered 2013-11-20: 500 [IU]
  Filled 2013-11-20: qty 5

## 2013-12-01 ENCOUNTER — Other Ambulatory Visit: Payer: Self-pay | Admitting: Oncology

## 2013-12-02 ENCOUNTER — Ambulatory Visit (HOSPITAL_BASED_OUTPATIENT_CLINIC_OR_DEPARTMENT_OTHER): Payer: Medicaid Other

## 2013-12-02 ENCOUNTER — Other Ambulatory Visit (HOSPITAL_BASED_OUTPATIENT_CLINIC_OR_DEPARTMENT_OTHER): Payer: Medicaid Other

## 2013-12-02 ENCOUNTER — Telehealth: Payer: Self-pay | Admitting: Oncology

## 2013-12-02 ENCOUNTER — Ambulatory Visit (HOSPITAL_BASED_OUTPATIENT_CLINIC_OR_DEPARTMENT_OTHER): Payer: Medicaid Other | Admitting: Nurse Practitioner

## 2013-12-02 VITALS — BP 119/80 | HR 53 | Temp 97.3°F | Resp 18 | Ht 72.0 in | Wt 184.0 lb

## 2013-12-02 DIAGNOSIS — C189 Malignant neoplasm of colon, unspecified: Secondary | ICD-10-CM

## 2013-12-02 DIAGNOSIS — D509 Iron deficiency anemia, unspecified: Secondary | ICD-10-CM

## 2013-12-02 DIAGNOSIS — C772 Secondary and unspecified malignant neoplasm of intra-abdominal lymph nodes: Secondary | ICD-10-CM

## 2013-12-02 DIAGNOSIS — C180 Malignant neoplasm of cecum: Secondary | ICD-10-CM

## 2013-12-02 DIAGNOSIS — Z5111 Encounter for antineoplastic chemotherapy: Secondary | ICD-10-CM

## 2013-12-02 LAB — COMPREHENSIVE METABOLIC PANEL (CC13)
ALT: 15 U/L (ref 0–55)
AST: 16 U/L (ref 5–34)
Albumin: 3.8 g/dL (ref 3.5–5.0)
Alkaline Phosphatase: 68 U/L (ref 40–150)
Anion Gap: 7 mEq/L (ref 3–11)
BUN: 10.4 mg/dL (ref 7.0–26.0)
CALCIUM: 8.9 mg/dL (ref 8.4–10.4)
CHLORIDE: 108 meq/L (ref 98–109)
CO2: 25 mEq/L (ref 22–29)
Creatinine: 1 mg/dL (ref 0.7–1.3)
Glucose: 86 mg/dl (ref 70–140)
POTASSIUM: 4.2 meq/L (ref 3.5–5.1)
Sodium: 141 mEq/L (ref 136–145)
Total Bilirubin: 0.26 mg/dL (ref 0.20–1.20)
Total Protein: 6.4 g/dL (ref 6.4–8.3)

## 2013-12-02 LAB — CBC WITH DIFFERENTIAL/PLATELET
BASO%: 0.5 % (ref 0.0–2.0)
Basophils Absolute: 0 10*3/uL (ref 0.0–0.1)
EOS%: 3.6 % (ref 0.0–7.0)
Eosinophils Absolute: 0.2 10*3/uL (ref 0.0–0.5)
HCT: 35.7 % — ABNORMAL LOW (ref 38.4–49.9)
HGB: 11 g/dL — ABNORMAL LOW (ref 13.0–17.1)
LYMPH#: 2.2 10*3/uL (ref 0.9–3.3)
LYMPH%: 36.8 % (ref 14.0–49.0)
MCH: 23.1 pg — ABNORMAL LOW (ref 27.2–33.4)
MCHC: 30.8 g/dL — AB (ref 32.0–36.0)
MCV: 74.8 fL — ABNORMAL LOW (ref 79.3–98.0)
MONO#: 0.5 10*3/uL (ref 0.1–0.9)
MONO%: 8.7 % (ref 0.0–14.0)
NEUT#: 3 10*3/uL (ref 1.5–6.5)
NEUT%: 50.4 % (ref 39.0–75.0)
Platelets: 246 10*3/uL (ref 140–400)
RBC: 4.77 10*6/uL (ref 4.20–5.82)
RDW: 26.8 % — AB (ref 11.0–14.6)
WBC: 5.9 10*3/uL (ref 4.0–10.3)
nRBC: 0 % (ref 0–0)

## 2013-12-02 MED ORDER — ONDANSETRON 8 MG/50ML IVPB (CHCC)
8.0000 mg | Freq: Once | INTRAVENOUS | Status: AC
  Administered 2013-12-02: 8 mg via INTRAVENOUS

## 2013-12-02 MED ORDER — LEUCOVORIN CALCIUM INJECTION 350 MG
400.0000 mg/m2 | Freq: Once | INTRAVENOUS | Status: AC
  Administered 2013-12-02: 800 mg via INTRAVENOUS
  Filled 2013-12-02: qty 40

## 2013-12-02 MED ORDER — SODIUM CHLORIDE 0.9 % IV SOLN
2400.0000 mg/m2 | INTRAVENOUS | Status: DC
  Administered 2013-12-02: 4750 mg via INTRAVENOUS
  Filled 2013-12-02: qty 95

## 2013-12-02 MED ORDER — ONDANSETRON 8 MG/NS 50 ML IVPB
INTRAVENOUS | Status: AC
  Filled 2013-12-02: qty 8

## 2013-12-02 MED ORDER — DEXAMETHASONE SODIUM PHOSPHATE 10 MG/ML IJ SOLN
INTRAMUSCULAR | Status: AC
  Filled 2013-12-02: qty 1

## 2013-12-02 MED ORDER — DEXTROSE 5 % IV SOLN
Freq: Once | INTRAVENOUS | Status: AC
  Administered 2013-12-02: 11:00:00 via INTRAVENOUS

## 2013-12-02 MED ORDER — OXALIPLATIN CHEMO INJECTION 100 MG/20ML
85.0000 mg/m2 | Freq: Once | INTRAVENOUS | Status: AC
  Administered 2013-12-02: 165 mg via INTRAVENOUS
  Filled 2013-12-02: qty 33

## 2013-12-02 MED ORDER — DEXAMETHASONE SODIUM PHOSPHATE 10 MG/ML IJ SOLN
10.0000 mg | Freq: Once | INTRAMUSCULAR | Status: AC
  Administered 2013-12-02: 10 mg via INTRAVENOUS

## 2013-12-02 MED ORDER — FLUOROURACIL CHEMO INJECTION 2.5 GM/50ML
400.0000 mg/m2 | Freq: Once | INTRAVENOUS | Status: AC
  Administered 2013-12-02: 800 mg via INTRAVENOUS
  Filled 2013-12-02: qty 16

## 2013-12-02 NOTE — Progress Notes (Signed)
  [facility] OFFICE PROGRESS NOTE   Diagnosis:  Colon cancer.  INTERVAL HISTORY:   Mr. Gallina returns as scheduled. He completed cycle 1 FOLFOX on 11/18/2013. He denies significant nausea/vomiting. No mouth sores. No diarrhea. No hand or foot pain or redness. No significant cold sensitivity. He resumed cold intake on day 2 with no problem. No numbness or tingling today. He has a good appetite and good energy level. He is gaining weight. He denies pain.  Objective:  Vital signs in last 24 hours:  Blood pressure 119/80, pulse 53, temperature 97.3 F (36.3 C), temperature source Oral, resp. rate 18, height 6' (1.829 m), weight 184 lb (83.462 kg), SpO2 100.00%.    HEENT: No thrush or ulcerations. Resp: Lungs clear. Cardio: Regular cardiac rhythm. GI: Abdomen soft and nontender. No hepatomegaly. Vascular: No leg edema. Calves nontender. Neuro: Vibratory sense mildly decreased over the fingertips per tuning fork exam.  Skin: No skin rash.   Port-A-Cath site without erythema  Lab Results:  Lab Results  Component Value Date   WBC 5.9 12/02/2013   HGB 11.0* 12/02/2013   HCT 35.7* 12/02/2013   MCV 74.8* 12/02/2013   PLT 246 12/02/2013   NEUTROABS 3.0 12/02/2013    Imaging:  No results found.  Medications: I have reviewed the patient's current medications.  Assessment/Plan: 1. Clinical stage IV (T4, N2b,M1a) adenocarcinoma of the cecum, status post a right colectomy 10/04/2013. No loss of mismatch repair protein expression, microsatellite stable  Omental nodule resected at the time of surgery 10/04/2013 with the pathology confirming a lymph node metastasis.  Chest CT 10/30/2013 with no definite signs of metastatic disease. 4 mm pulmonary nodule lateral segment of the right middle lobe. Initiation of FOLFOX chemotherapy 11/18/2013. 2. Microcytic anemia-likely iron deficiency anemia. He continues oral iron. 3. History of tobacco use. 4. Right external iliac atheroma  noted on the CT abdomen 10/03/2013 -evaluated by Dr. Oneida Alar.   Disposition: He appears stable. He has completed 1 cycle of FOLFOX. Plan to proceed with cycle 2 today as scheduled. He will return for a followup visit and cycle 3 in 2 weeks. He will call the office in the interim with any problems.    [name] ANP/GNP-BC   12/02/2013  10:58 AM

## 2013-12-02 NOTE — Telephone Encounter (Signed)
gv and printed appt sched and avs for pt for June and July....sed added tx. °

## 2013-12-02 NOTE — Patient Instructions (Signed)
[facility] Discharge Instructions for Patients Receiving Chemotherapy  Today you received the following chemotherapy agents 5 FU/ Leucovorin/Oxaliplatin  To help prevent nausea and vomiting after your treatment, we encourage you to take your nausea medication as prescribed.  If you develop nausea and vomiting that is not controlled by your nausea medication, call the clinic.   BELOW ARE SYMPTOMS THAT SHOULD BE REPORTED IMMEDIATELY:  *FEVER GREATER THAN 100.5 F  *CHILLS WITH OR WITHOUT FEVER  NAUSEA AND VOMITING THAT IS NOT CONTROLLED WITH YOUR NAUSEA MEDICATION  *UNUSUAL SHORTNESS OF BREATH  *UNUSUAL BRUISING OR BLEEDING  TENDERNESS IN MOUTH AND THROAT WITH OR WITHOUT PRESENCE OF ULCERS  *URINARY PROBLEMS  *BOWEL PROBLEMS  UNUSUAL RASH Items with * indicate a potential emergency and should be followed up as soon as possible.  Feel free to call the clinic you have any questions or concerns. The clinic phone number is [phone].     

## 2013-12-04 ENCOUNTER — Ambulatory Visit (HOSPITAL_BASED_OUTPATIENT_CLINIC_OR_DEPARTMENT_OTHER): Payer: Medicaid Other

## 2013-12-04 VITALS — BP 123/87 | HR 64 | Temp 98.1°F

## 2013-12-04 DIAGNOSIS — C180 Malignant neoplasm of cecum: Secondary | ICD-10-CM

## 2013-12-04 MED ORDER — HEPARIN SOD (PORK) LOCK FLUSH 100 UNIT/ML IV SOLN
500.0000 [IU] | Freq: Once | INTRAVENOUS | Status: AC | PRN
  Administered 2013-12-04: 500 [IU]
  Filled 2013-12-04: qty 5

## 2013-12-04 MED ORDER — SODIUM CHLORIDE 0.9 % IJ SOLN
10.0000 mL | INTRAMUSCULAR | Status: DC | PRN
  Administered 2013-12-04: 10 mL
  Filled 2013-12-04: qty 10

## 2013-12-15 ENCOUNTER — Other Ambulatory Visit: Payer: Self-pay | Admitting: Oncology

## 2013-12-16 ENCOUNTER — Ambulatory Visit: Payer: Medicaid Other | Admitting: Nutrition

## 2013-12-16 ENCOUNTER — Encounter: Payer: Medicaid Other | Admitting: Nutrition

## 2013-12-16 ENCOUNTER — Ambulatory Visit: Payer: Medicaid Other

## 2013-12-16 ENCOUNTER — Ambulatory Visit (HOSPITAL_BASED_OUTPATIENT_CLINIC_OR_DEPARTMENT_OTHER): Payer: Medicaid Other | Admitting: Oncology

## 2013-12-16 ENCOUNTER — Ambulatory Visit (HOSPITAL_BASED_OUTPATIENT_CLINIC_OR_DEPARTMENT_OTHER): Payer: Medicaid Other

## 2013-12-16 ENCOUNTER — Telehealth: Payer: Self-pay | Admitting: *Deleted

## 2013-12-16 ENCOUNTER — Other Ambulatory Visit (HOSPITAL_BASED_OUTPATIENT_CLINIC_OR_DEPARTMENT_OTHER): Payer: Medicaid Other

## 2013-12-16 ENCOUNTER — Telehealth: Payer: Self-pay | Admitting: Oncology

## 2013-12-16 VITALS — BP 100/68 | HR 65 | Temp 97.6°F | Resp 20 | Ht 72.0 in | Wt 183.1 lb

## 2013-12-16 DIAGNOSIS — Z95828 Presence of other vascular implants and grafts: Secondary | ICD-10-CM

## 2013-12-16 DIAGNOSIS — C189 Malignant neoplasm of colon, unspecified: Secondary | ICD-10-CM

## 2013-12-16 DIAGNOSIS — Z87891 Personal history of nicotine dependence: Secondary | ICD-10-CM

## 2013-12-16 DIAGNOSIS — C180 Malignant neoplasm of cecum: Secondary | ICD-10-CM

## 2013-12-16 DIAGNOSIS — C772 Secondary and unspecified malignant neoplasm of intra-abdominal lymph nodes: Secondary | ICD-10-CM

## 2013-12-16 DIAGNOSIS — Z5111 Encounter for antineoplastic chemotherapy: Secondary | ICD-10-CM

## 2013-12-16 DIAGNOSIS — D509 Iron deficiency anemia, unspecified: Secondary | ICD-10-CM

## 2013-12-16 DIAGNOSIS — R911 Solitary pulmonary nodule: Secondary | ICD-10-CM

## 2013-12-16 LAB — CBC WITH DIFFERENTIAL/PLATELET
BASO%: 0.8 % (ref 0.0–2.0)
BASOS ABS: 0 10*3/uL (ref 0.0–0.1)
EOS ABS: 0.2 10*3/uL (ref 0.0–0.5)
EOS%: 3.2 % (ref 0.0–7.0)
HCT: 36.9 % — ABNORMAL LOW (ref 38.4–49.9)
HEMOGLOBIN: 11.8 g/dL — AB (ref 13.0–17.1)
LYMPH%: 30.3 % (ref 14.0–49.0)
MCH: 24.5 pg — AB (ref 27.2–33.4)
MCHC: 31.9 g/dL — ABNORMAL LOW (ref 32.0–36.0)
MCV: 76.7 fL — AB (ref 79.3–98.0)
MONO#: 0.7 10*3/uL (ref 0.1–0.9)
MONO%: 11.7 % (ref 0.0–14.0)
NEUT#: 3.2 10*3/uL (ref 1.5–6.5)
NEUT%: 54 % (ref 39.0–75.0)
PLATELETS: 202 10*3/uL (ref 140–400)
RBC: 4.8 10*6/uL (ref 4.20–5.82)
RDW: 28.9 % — ABNORMAL HIGH (ref 11.0–14.6)
WBC: 5.8 10*3/uL (ref 4.0–10.3)
lymph#: 1.8 10*3/uL (ref 0.9–3.3)

## 2013-12-16 LAB — COMPREHENSIVE METABOLIC PANEL (CC13)
ALK PHOS: 71 U/L (ref 40–150)
ALT: 22 U/L (ref 0–55)
AST: 23 U/L (ref 5–34)
Albumin: 3.8 g/dL (ref 3.5–5.0)
Anion Gap: 8 mEq/L (ref 3–11)
BILIRUBIN TOTAL: 0.21 mg/dL (ref 0.20–1.20)
BUN: 10.6 mg/dL (ref 7.0–26.0)
CO2: 23 mEq/L (ref 22–29)
CREATININE: 0.9 mg/dL (ref 0.7–1.3)
Calcium: 9 mg/dL (ref 8.4–10.4)
Chloride: 108 mEq/L (ref 98–109)
GLUCOSE: 92 mg/dL (ref 70–140)
Potassium: 4.1 mEq/L (ref 3.5–5.1)
Sodium: 139 mEq/L (ref 136–145)
Total Protein: 6.5 g/dL (ref 6.4–8.3)

## 2013-12-16 LAB — TECHNOLOGIST REVIEW

## 2013-12-16 MED ORDER — DEXAMETHASONE SODIUM PHOSPHATE 10 MG/ML IJ SOLN
INTRAMUSCULAR | Status: AC
  Filled 2013-12-16: qty 1

## 2013-12-16 MED ORDER — FLUOROURACIL CHEMO INJECTION 2.5 GM/50ML
400.0000 mg/m2 | Freq: Once | INTRAVENOUS | Status: AC
  Administered 2013-12-16: 800 mg via INTRAVENOUS
  Filled 2013-12-16: qty 16

## 2013-12-16 MED ORDER — SODIUM CHLORIDE 0.9 % IV SOLN
2400.0000 mg/m2 | INTRAVENOUS | Status: DC
  Administered 2013-12-16: 4750 mg via INTRAVENOUS
  Filled 2013-12-16: qty 95

## 2013-12-16 MED ORDER — LEUCOVORIN CALCIUM INJECTION 350 MG
400.0000 mg/m2 | Freq: Once | INTRAMUSCULAR | Status: AC
  Administered 2013-12-16: 800 mg via INTRAVENOUS
  Filled 2013-12-16: qty 40

## 2013-12-16 MED ORDER — DEXAMETHASONE SODIUM PHOSPHATE 10 MG/ML IJ SOLN
10.0000 mg | Freq: Once | INTRAMUSCULAR | Status: AC
  Administered 2013-12-16: 10 mg via INTRAVENOUS

## 2013-12-16 MED ORDER — DEXTROSE 5 % IV SOLN
Freq: Once | INTRAVENOUS | Status: AC
  Administered 2013-12-16: 11:00:00 via INTRAVENOUS

## 2013-12-16 MED ORDER — SODIUM CHLORIDE 0.9 % IJ SOLN
10.0000 mL | INTRAMUSCULAR | Status: DC | PRN
  Administered 2013-12-16: 10 mL via INTRAVENOUS
  Filled 2013-12-16: qty 10

## 2013-12-16 MED ORDER — ONDANSETRON 8 MG/NS 50 ML IVPB
INTRAVENOUS | Status: AC
  Filled 2013-12-16: qty 8

## 2013-12-16 MED ORDER — ONDANSETRON 8 MG/50ML IVPB (CHCC)
8.0000 mg | Freq: Once | INTRAVENOUS | Status: AC
  Administered 2013-12-16: 8 mg via INTRAVENOUS

## 2013-12-16 MED ORDER — OXALIPLATIN CHEMO INJECTION 100 MG/20ML
85.0000 mg/m2 | Freq: Once | INTRAVENOUS | Status: AC
  Administered 2013-12-16: 165 mg via INTRAVENOUS
  Filled 2013-12-16: qty 33

## 2013-12-16 NOTE — Progress Notes (Signed)
Nutrition followup completed with patient.  He reports he has a good appetite and is eating well.  He denies nutrition side effects.  Weight has increased and was documented as 183.1 pounds on June 29 from 159.4 pounds May 11.  No nutrition concerns.  Nutrition diagnosis: Food and nutrition related knowledge deficit resolved.  Provided patient with support and encouragement to continue small frequent meals and snacks.  Provided coupons at patient's request for oral nutrition supplements.  Patient to continue adequate calories and protein for repletion of lean body mass.  Next visit: No followup is scheduled.  Patient will contact me with any nutrition needs.

## 2013-12-16 NOTE — Patient Instructions (Signed)
[facility] Discharge Instructions for Patients Receiving Chemotherapy  Today you received the following chemotherapy agents Oxaliplatin, Leucovorin and 5FU.  To help prevent nausea and vomiting after your treatment, we encourage you to take your nausea medication.   If you develop nausea and vomiting that is not controlled by your nausea medication, call the clinic.   BELOW ARE SYMPTOMS THAT SHOULD BE REPORTED IMMEDIATELY:  *FEVER GREATER THAN 100.5 F  *CHILLS WITH OR WITHOUT FEVER  NAUSEA AND VOMITING THAT IS NOT CONTROLLED WITH YOUR NAUSEA MEDICATION  *UNUSUAL SHORTNESS OF BREATH  *UNUSUAL BRUISING OR BLEEDING  TENDERNESS IN MOUTH AND THROAT WITH OR WITHOUT PRESENCE OF ULCERS  *URINARY PROBLEMS  *BOWEL PROBLEMS  UNUSUAL RASH Items with * indicate a potential emergency and should be followed up as soon as possible.  Feel free to call the clinic you have any questions or concerns. The clinic phone number is [phone].    

## 2013-12-16 NOTE — Telephone Encounter (Signed)
Gave pt appt for lab,md and chemo for july  °

## 2013-12-16 NOTE — Progress Notes (Signed)
  [facility] OFFICE PROGRESS NOTE   Diagnosis: Colon cancer  INTERVAL HISTORY:   He returns as scheduled. He completed a second cycle of FOLFOX 12/02/2013. Mild nausea during the chemotherapy infusion on day 1. No other nausea. No neuropathy symptoms. No diarrhea.  Objective:  Vital signs in last 24 hours:  Blood pressure 100/68, pulse 65, temperature 97.6 F (36.4 C), temperature source Oral, resp. rate 20, height 6' (1.829 m), weight 183 lb 1.6 oz (83.054 kg).    HEENT: No thrush or ulcer Resp: Lungs clear bilaterally Cardio: Regular rate and rhythm GI: No hepatomegaly, nontender Vascular: No leg edema  Skin: Palms without erythema   Portacath/PICC-without erythema  Lab Results:  Lab Results  Component Value Date   WBC 5.8 12/16/2013   HGB 11.8* 12/16/2013   HCT 36.9* 12/16/2013   MCV 76.7* 12/16/2013   PLT 202 12/16/2013   NEUTROABS 3.2 12/16/2013     Medications: I have reviewed the patient's current medications.  Assessment/Plan: 1. Clinical stage IV (T4, N2b,M1a) adenocarcinoma of the cecum, status post a right colectomy 10/04/2013. No loss of mismatch repair protein expression, microsatellite stable  Omental nodule resected at the time of surgery 10/04/2013 with the pathology confirming a lymph node metastasis.  Chest CT 10/30/2013 with no definite signs of metastatic disease. 4 mm pulmonary nodule lateral segment of the right middle lobe.  Initiation of FOLFOX chemotherapy 11/18/2013. 2. Microcytic anemia-likely iron deficiency anemia. Improved. He continues oral iron. 3. History of tobacco use. 4. 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 Oneida Alar.   Disposition:  Rick Davenport has completed 2 cycles of FOLFOX. He has tolerated the chemotherapy well to date. The plan is to proceed with cycle 3 today. He will return for an office visit and chemotherapy in 2 weeks.  Betsy Coder, MD  12/16/2013  10:15  AM

## 2013-12-16 NOTE — Patient Instructions (Signed)

## 2013-12-16 NOTE — Telephone Encounter (Signed)
Per [facility] schedule appt.

## 2013-12-18 ENCOUNTER — Ambulatory Visit (HOSPITAL_BASED_OUTPATIENT_CLINIC_OR_DEPARTMENT_OTHER): Payer: Medicaid Other

## 2013-12-18 VITALS — BP 106/77 | HR 74 | Temp 98.1°F

## 2013-12-18 DIAGNOSIS — C180 Malignant neoplasm of cecum: Secondary | ICD-10-CM

## 2013-12-18 DIAGNOSIS — C772 Secondary and unspecified malignant neoplasm of intra-abdominal lymph nodes: Secondary | ICD-10-CM

## 2013-12-18 MED ORDER — HEPARIN SOD (PORK) LOCK FLUSH 100 UNIT/ML IV SOLN
500.0000 [IU] | Freq: Once | INTRAVENOUS | Status: AC | PRN
  Administered 2013-12-18: 500 [IU]
  Filled 2013-12-18: qty 5

## 2013-12-18 MED ORDER — SODIUM CHLORIDE 0.9 % IJ SOLN
10.0000 mL | INTRAMUSCULAR | Status: DC | PRN
  Administered 2013-12-18: 10 mL
  Filled 2013-12-18: qty 10

## 2013-12-29 ENCOUNTER — Other Ambulatory Visit: Payer: Self-pay | Admitting: Oncology

## 2013-12-30 ENCOUNTER — Telehealth: Payer: Self-pay | Admitting: Oncology

## 2013-12-30 ENCOUNTER — Ambulatory Visit (HOSPITAL_BASED_OUTPATIENT_CLINIC_OR_DEPARTMENT_OTHER): Payer: Medicaid Other

## 2013-12-30 ENCOUNTER — Other Ambulatory Visit (HOSPITAL_BASED_OUTPATIENT_CLINIC_OR_DEPARTMENT_OTHER): Payer: Medicaid Other

## 2013-12-30 ENCOUNTER — Other Ambulatory Visit: Payer: Self-pay | Admitting: *Deleted

## 2013-12-30 ENCOUNTER — Ambulatory Visit (HOSPITAL_BASED_OUTPATIENT_CLINIC_OR_DEPARTMENT_OTHER): Payer: Medicaid Other | Admitting: Nurse Practitioner

## 2013-12-30 VITALS — BP 110/80 | HR 58 | Temp 97.6°F

## 2013-12-30 VITALS — BP 110/75 | HR 63 | Temp 97.8°F | Resp 19 | Ht 72.0 in | Wt 185.9 lb

## 2013-12-30 DIAGNOSIS — C180 Malignant neoplasm of cecum: Secondary | ICD-10-CM

## 2013-12-30 DIAGNOSIS — C189 Malignant neoplasm of colon, unspecified: Secondary | ICD-10-CM

## 2013-12-30 DIAGNOSIS — C786 Secondary malignant neoplasm of retroperitoneum and peritoneum: Secondary | ICD-10-CM

## 2013-12-30 DIAGNOSIS — R911 Solitary pulmonary nodule: Secondary | ICD-10-CM

## 2013-12-30 DIAGNOSIS — Z95828 Presence of other vascular implants and grafts: Secondary | ICD-10-CM

## 2013-12-30 DIAGNOSIS — Z452 Encounter for adjustment and management of vascular access device: Secondary | ICD-10-CM

## 2013-12-30 DIAGNOSIS — C772 Secondary and unspecified malignant neoplasm of intra-abdominal lymph nodes: Secondary | ICD-10-CM

## 2013-12-30 DIAGNOSIS — Z5111 Encounter for antineoplastic chemotherapy: Secondary | ICD-10-CM

## 2013-12-30 DIAGNOSIS — D509 Iron deficiency anemia, unspecified: Secondary | ICD-10-CM

## 2013-12-30 LAB — CBC WITH DIFFERENTIAL/PLATELET
BASO%: 1.2 % (ref 0.0–2.0)
Basophils Absolute: 0.1 10*3/uL (ref 0.0–0.1)
EOS ABS: 0.2 10*3/uL (ref 0.0–0.5)
EOS%: 3.8 % (ref 0.0–7.0)
HEMATOCRIT: 39.8 % (ref 38.4–49.9)
HGB: 12.6 g/dL — ABNORMAL LOW (ref 13.0–17.1)
LYMPH#: 1.7 10*3/uL (ref 0.9–3.3)
LYMPH%: 32.5 % (ref 14.0–49.0)
MCH: 24.8 pg — ABNORMAL LOW (ref 27.2–33.4)
MCHC: 31.6 g/dL — ABNORMAL LOW (ref 32.0–36.0)
MCV: 78.6 fL — ABNORMAL LOW (ref 79.3–98.0)
MONO#: 0.7 10*3/uL (ref 0.1–0.9)
MONO%: 12.6 % (ref 0.0–14.0)
NEUT%: 49.9 % (ref 39.0–75.0)
NEUTROS ABS: 2.6 10*3/uL (ref 1.5–6.5)
PLATELETS: 158 10*3/uL (ref 140–400)
RBC: 5.06 10*6/uL (ref 4.20–5.82)
RDW: 26.6 % — ABNORMAL HIGH (ref 11.0–14.6)
WBC: 5.2 10*3/uL (ref 4.0–10.3)

## 2013-12-30 LAB — COMPREHENSIVE METABOLIC PANEL (CC13)
ALT: 26 U/L (ref 0–55)
AST: 27 U/L (ref 5–34)
Albumin: 3.6 g/dL (ref 3.5–5.0)
Alkaline Phosphatase: 69 U/L (ref 40–150)
Anion Gap: 8 mEq/L (ref 3–11)
BILIRUBIN TOTAL: 0.24 mg/dL (ref 0.20–1.20)
BUN: 17 mg/dL (ref 7.0–26.0)
CHLORIDE: 110 meq/L — AB (ref 98–109)
CO2: 24 mEq/L (ref 22–29)
Calcium: 9.1 mg/dL (ref 8.4–10.4)
Creatinine: 1.1 mg/dL (ref 0.7–1.3)
GLUCOSE: 94 mg/dL (ref 70–140)
Potassium: 4.2 mEq/L (ref 3.5–5.1)
Sodium: 143 mEq/L (ref 136–145)
TOTAL PROTEIN: 6.6 g/dL (ref 6.4–8.3)

## 2013-12-30 MED ORDER — LEUCOVORIN CALCIUM INJECTION 350 MG
400.0000 mg/m2 | Freq: Once | INTRAVENOUS | Status: AC
  Administered 2013-12-30: 800 mg via INTRAVENOUS
  Filled 2013-12-30: qty 40

## 2013-12-30 MED ORDER — ONDANSETRON 8 MG/50ML IVPB (CHCC)
8.0000 mg | Freq: Once | INTRAVENOUS | Status: AC
  Administered 2013-12-30: 8 mg via INTRAVENOUS

## 2013-12-30 MED ORDER — SODIUM CHLORIDE 0.9 % IJ SOLN
10.0000 mL | INTRAMUSCULAR | Status: DC | PRN
  Administered 2013-12-30: 10 mL via INTRAVENOUS
  Filled 2013-12-30: qty 10

## 2013-12-30 MED ORDER — ONDANSETRON 8 MG/NS 50 ML IVPB
INTRAVENOUS | Status: AC
  Filled 2013-12-30: qty 8

## 2013-12-30 MED ORDER — FLUOROURACIL CHEMO INJECTION 2.5 GM/50ML
400.0000 mg/m2 | Freq: Once | INTRAVENOUS | Status: AC
  Administered 2013-12-30: 800 mg via INTRAVENOUS
  Filled 2013-12-30: qty 16

## 2013-12-30 MED ORDER — DEXAMETHASONE SODIUM PHOSPHATE 10 MG/ML IJ SOLN
10.0000 mg | Freq: Once | INTRAMUSCULAR | Status: AC
  Administered 2013-12-30: 10 mg via INTRAVENOUS

## 2013-12-30 MED ORDER — OXALIPLATIN CHEMO INJECTION 100 MG/20ML
85.0000 mg/m2 | Freq: Once | INTRAVENOUS | Status: AC
  Administered 2013-12-30: 165 mg via INTRAVENOUS
  Filled 2013-12-30: qty 33

## 2013-12-30 MED ORDER — DEXAMETHASONE SODIUM PHOSPHATE 10 MG/ML IJ SOLN
INTRAMUSCULAR | Status: AC
  Filled 2013-12-30: qty 1

## 2013-12-30 MED ORDER — DEXTROSE 5 % IV SOLN
Freq: Once | INTRAVENOUS | Status: AC
  Administered 2013-12-30: 13:00:00 via INTRAVENOUS

## 2013-12-30 MED ORDER — SODIUM CHLORIDE 0.9 % IJ SOLN
10.0000 mL | INTRAMUSCULAR | Status: DC | PRN
  Filled 2013-12-30: qty 10

## 2013-12-30 MED ORDER — FLUOROURACIL CHEMO INJECTION 5 GM/100ML
2400.0000 mg/m2 | INTRAVENOUS | Status: DC
  Administered 2013-12-30: 4750 mg via INTRAVENOUS
  Filled 2013-12-30: qty 95

## 2013-12-30 NOTE — Progress Notes (Signed)
  [facility] OFFICE PROGRESS NOTE   Diagnosis:  Colon cancer.  INTERVAL HISTORY:   Rick Davenport returns as scheduled. He completed cycle 3 FOLFOX on 12/16/2013. He denies nausea/vomiting. No mouth sores. No diarrhea. He avoids eating and drinking cold for about 5-6 days after each treatment. No persistent neuropathy symptoms. He has a good appetite. He is gaining weight. He overall feels well.  Objective:  Vital signs in last 24 hours:  Blood pressure 110/75, pulse 63, temperature 97.8 F (36.6 C), temperature source Oral, resp. rate 19, height 6' (1.829 m), weight 185 lb 14.4 oz (84.324 kg), SpO2 100.00%.    HEENT: No thrush or ulcerations. Resp: Lungs clear. No wheezes or rales. Cardio: Regular cardiac rhythm. GI: Abdomen soft and nontender. No hepatomegaly. Vascular: No leg edema. Calves soft and nontender. Neuro: Vibratory sense intact over the fingertips per tuning fork exam.  Skin: No rash.  Port-A-Cath site without erythema.    Lab Results:  Lab Results  Component Value Date   WBC 5.2 12/30/2013   HGB 12.6* 12/30/2013   HCT 39.8 12/30/2013   MCV 78.6* 12/30/2013   PLT 158 12/30/2013   NEUTROABS 2.6 12/30/2013    Imaging:  No results found.  Medications: I have reviewed the patient's current medications.  Assessment/Plan: 1. Clinical stage IV (T4, N2b,M1a) adenocarcinoma of the cecum, status post a right colectomy 10/04/2013. No loss of mismatch repair protein expression, microsatellite stable  Omental nodule resected at the time of surgery 10/04/2013 with the pathology confirming a lymph node metastasis.  Chest CT 10/30/2013 with no definite signs of metastatic disease. 4 mm pulmonary nodule lateral segment of the right middle lobe.  Initiation of FOLFOX chemotherapy 11/18/2013. 2. Microcytic anemia-likely iron deficiency anemia. Improved. He continues oral iron. 3. History of tobacco use. 4. Right external iliac atheroma noted on the CT abdomen  10/03/2013 -evaluated by Dr. Oneida Davenport.   Disposition: Rick Davenport appears stable. He has completed 3 cycles of FOLFOX. He is tolerating the chemotherapy well. Plan to proceed with cycle 4 today as scheduled. He will be out of town in 2 weeks. He will return for a followup visit and cycle 5 FOLFOX on 01/15/2014. He will contact the office in the interim with any problems.    Rick Davenport ANP/GNP-BC   12/30/2013  11:19 AM

## 2013-12-30 NOTE — Telephone Encounter (Signed)
gv and printed appts ched and avs for pt for July and Aug...sed added tx. °

## 2013-12-30 NOTE — Patient Instructions (Signed)
[facility] Discharge Instructions for Patients Receiving Chemotherapy  Today you received the following chemotherapy agents FOLFOX.   To help prevent nausea and vomiting after your treatment, we encourage you to take your nausea medication as directed.    If you develop nausea and vomiting that is not controlled by your nausea medication, call the clinic.   BELOW ARE SYMPTOMS THAT SHOULD BE REPORTED IMMEDIATELY:  *FEVER GREATER THAN 100.5 F  *CHILLS WITH OR WITHOUT FEVER  NAUSEA AND VOMITING THAT IS NOT CONTROLLED WITH YOUR NAUSEA MEDICATION  *UNUSUAL SHORTNESS OF BREATH  *UNUSUAL BRUISING OR BLEEDING  TENDERNESS IN MOUTH AND THROAT WITH OR WITHOUT PRESENCE OF ULCERS  *URINARY PROBLEMS  *BOWEL PROBLEMS  UNUSUAL RASH Items with * indicate a potential emergency and should be followed up as soon as possible.  Feel free to call the clinic you have any questions or concerns. The clinic phone number is [phone].    

## 2014-01-01 ENCOUNTER — Ambulatory Visit (HOSPITAL_BASED_OUTPATIENT_CLINIC_OR_DEPARTMENT_OTHER): Payer: Medicaid Other

## 2014-01-01 VITALS — BP 111/78 | HR 75 | Temp 97.8°F

## 2014-01-01 DIAGNOSIS — C180 Malignant neoplasm of cecum: Secondary | ICD-10-CM

## 2014-01-01 DIAGNOSIS — Z452 Encounter for adjustment and management of vascular access device: Secondary | ICD-10-CM

## 2014-01-01 MED ORDER — SODIUM CHLORIDE 0.9 % IJ SOLN
10.0000 mL | INTRAMUSCULAR | Status: DC | PRN
  Administered 2014-01-01: 10 mL
  Filled 2014-01-01: qty 10

## 2014-01-01 MED ORDER — HEPARIN SOD (PORK) LOCK FLUSH 100 UNIT/ML IV SOLN
500.0000 [IU] | Freq: Once | INTRAVENOUS | Status: AC | PRN
  Administered 2014-01-01: 500 [IU]
  Filled 2014-01-01: qty 5

## 2014-01-12 ENCOUNTER — Other Ambulatory Visit: Payer: Self-pay | Admitting: Oncology

## 2014-01-15 ENCOUNTER — Ambulatory Visit (HOSPITAL_BASED_OUTPATIENT_CLINIC_OR_DEPARTMENT_OTHER): Payer: Medicaid Other

## 2014-01-15 ENCOUNTER — Ambulatory Visit (HOSPITAL_BASED_OUTPATIENT_CLINIC_OR_DEPARTMENT_OTHER): Payer: Medicaid Other | Admitting: Oncology

## 2014-01-15 ENCOUNTER — Telehealth: Payer: Self-pay | Admitting: Oncology

## 2014-01-15 ENCOUNTER — Other Ambulatory Visit: Payer: Self-pay | Admitting: Oncology

## 2014-01-15 ENCOUNTER — Other Ambulatory Visit (HOSPITAL_BASED_OUTPATIENT_CLINIC_OR_DEPARTMENT_OTHER): Payer: Medicaid Other

## 2014-01-15 VITALS — BP 110/81 | HR 76 | Temp 98.1°F | Resp 18 | Ht 72.0 in | Wt 186.9 lb

## 2014-01-15 DIAGNOSIS — Z5111 Encounter for antineoplastic chemotherapy: Secondary | ICD-10-CM

## 2014-01-15 DIAGNOSIS — C180 Malignant neoplasm of cecum: Secondary | ICD-10-CM

## 2014-01-15 DIAGNOSIS — C189 Malignant neoplasm of colon, unspecified: Secondary | ICD-10-CM

## 2014-01-15 DIAGNOSIS — D509 Iron deficiency anemia, unspecified: Secondary | ICD-10-CM

## 2014-01-15 DIAGNOSIS — Z95828 Presence of other vascular implants and grafts: Secondary | ICD-10-CM

## 2014-01-15 DIAGNOSIS — C772 Secondary and unspecified malignant neoplasm of intra-abdominal lymph nodes: Secondary | ICD-10-CM

## 2014-01-15 LAB — COMPREHENSIVE METABOLIC PANEL (CC13)
ALK PHOS: 76 U/L (ref 40–150)
ALT: 24 U/L (ref 0–55)
ANION GAP: 7 meq/L (ref 3–11)
AST: 36 U/L — ABNORMAL HIGH (ref 5–34)
Albumin: 3.6 g/dL (ref 3.5–5.0)
BILIRUBIN TOTAL: 0.34 mg/dL (ref 0.20–1.20)
BUN: 14 mg/dL (ref 7.0–26.0)
CO2: 25 meq/L (ref 22–29)
Calcium: 9.2 mg/dL (ref 8.4–10.4)
Chloride: 110 mEq/L — ABNORMAL HIGH (ref 98–109)
Creatinine: 0.8 mg/dL (ref 0.7–1.3)
GLUCOSE: 62 mg/dL — AB (ref 70–140)
Potassium: 3.8 mEq/L (ref 3.5–5.1)
Sodium: 141 mEq/L (ref 136–145)
Total Protein: 6.7 g/dL (ref 6.4–8.3)

## 2014-01-15 LAB — CBC WITH DIFFERENTIAL/PLATELET
BASO%: 0.6 % (ref 0.0–2.0)
BASOS ABS: 0 10*3/uL (ref 0.0–0.1)
EOS ABS: 0.3 10*3/uL (ref 0.0–0.5)
EOS%: 5.8 % (ref 0.0–7.0)
HCT: 39.3 % (ref 38.4–49.9)
HEMOGLOBIN: 13 g/dL (ref 13.0–17.1)
LYMPH%: 53.9 % — AB (ref 14.0–49.0)
MCH: 25.1 pg — ABNORMAL LOW (ref 27.2–33.4)
MCHC: 33.1 g/dL (ref 32.0–36.0)
MCV: 76 fL — AB (ref 79.3–98.0)
MONO#: 0.7 10*3/uL (ref 0.1–0.9)
MONO%: 13.1 % (ref 0.0–14.0)
NEUT%: 26.6 % — ABNORMAL LOW (ref 39.0–75.0)
NEUTROS ABS: 1.4 10*3/uL — AB (ref 1.5–6.5)
PLATELETS: 139 10*3/uL — AB (ref 140–400)
RBC: 5.17 10*6/uL (ref 4.20–5.82)
RDW: 22 % — AB (ref 11.0–14.6)
WBC: 5.3 10*3/uL (ref 4.0–10.3)
lymph#: 2.9 10*3/uL (ref 0.9–3.3)
nRBC: 0 % (ref 0–0)

## 2014-01-15 MED ORDER — OXALIPLATIN CHEMO INJECTION 100 MG/20ML
85.0000 mg/m2 | Freq: Once | INTRAVENOUS | Status: AC
  Administered 2014-01-15: 165 mg via INTRAVENOUS
  Filled 2014-01-15: qty 33

## 2014-01-15 MED ORDER — ONDANSETRON 8 MG/NS 50 ML IVPB
INTRAVENOUS | Status: AC
  Filled 2014-01-15: qty 8

## 2014-01-15 MED ORDER — DEXTROSE 5 % IV SOLN
400.0000 mg/m2 | Freq: Once | INTRAVENOUS | Status: AC
  Administered 2014-01-15: 800 mg via INTRAVENOUS
  Filled 2014-01-15: qty 40

## 2014-01-15 MED ORDER — FLUOROURACIL CHEMO INJECTION 5 GM/100ML
2400.0000 mg/m2 | INTRAVENOUS | Status: DC
  Administered 2014-01-15: 4750 mg via INTRAVENOUS
  Filled 2014-01-15: qty 95

## 2014-01-15 MED ORDER — DEXAMETHASONE SODIUM PHOSPHATE 10 MG/ML IJ SOLN
10.0000 mg | Freq: Once | INTRAMUSCULAR | Status: AC
  Administered 2014-01-15: 10 mg via INTRAVENOUS

## 2014-01-15 MED ORDER — DEXAMETHASONE SODIUM PHOSPHATE 10 MG/ML IJ SOLN
INTRAMUSCULAR | Status: AC
  Filled 2014-01-15: qty 1

## 2014-01-15 MED ORDER — ONDANSETRON 8 MG/50ML IVPB (CHCC)
8.0000 mg | Freq: Once | INTRAVENOUS | Status: AC
  Administered 2014-01-15: 8 mg via INTRAVENOUS

## 2014-01-15 MED ORDER — SODIUM CHLORIDE 0.9 % IJ SOLN
10.0000 mL | INTRAMUSCULAR | Status: DC | PRN
  Administered 2014-01-15: 10 mL via INTRAVENOUS
  Filled 2014-01-15: qty 10

## 2014-01-15 MED ORDER — HEPARIN SOD (PORK) LOCK FLUSH 100 UNIT/ML IV SOLN
500.0000 [IU] | Freq: Once | INTRAVENOUS | Status: DC | PRN
  Filled 2014-01-15: qty 5

## 2014-01-15 MED ORDER — SODIUM CHLORIDE 0.9 % IJ SOLN
10.0000 mL | INTRAMUSCULAR | Status: DC | PRN
  Filled 2014-01-15: qty 10

## 2014-01-15 MED ORDER — FLUOROURACIL CHEMO INJECTION 2.5 GM/50ML
400.0000 mg/m2 | Freq: Once | INTRAVENOUS | Status: AC
  Administered 2014-01-15: 800 mg via INTRAVENOUS
  Filled 2014-01-15: qty 16

## 2014-01-15 MED ORDER — DEXTROSE 5 % IV SOLN
Freq: Once | INTRAVENOUS | Status: AC
  Administered 2014-01-15: 10:00:00 via INTRAVENOUS

## 2014-01-15 NOTE — Progress Notes (Signed)
Received verbal order from Dr. Benay Spice with OK to treat despite anc 1.4 & pt will receive neulasta with pump d/c.  Order repeated & verified.  Pt education given for neulasta.

## 2014-01-15 NOTE — Patient Instructions (Addendum)
[facility] Discharge Instructions for Patients Receiving Chemotherapy  Today you received the following chemotherapy agents: 5FU/Leucovorin/oxaliplatin. To help prevent nausea and vomiting after your treatment, we encourage you to take your nausea medication: as directed. When you return to have pump discontinued, you will also receive Neulasta (pegfilgrastin)  To help raise your white blood cell counts.   Pegfilgrastim injection What is this medicine? PEGFILGRASTIM (peg fil GRA stim) is a long-acting granulocyte colony-stimulating factor that stimulates the growth of neutrophils, a type of white blood cell important in the body's fight against infection. It is used to reduce the incidence of fever and infection in patients with certain types of cancer who are receiving chemotherapy that affects the bone marrow. This medicine may be used for other purposes; ask your health care provider or pharmacist if you have questions. COMMON BRAND NAME(S): Neulasta What should I tell my health care provider before I take this medicine? They need to know if you have any of these conditions: -latex allergy -ongoing radiation therapy -sickle cell disease -skin reactions to acrylic adhesives (On-Body Injector only) -an unusual or allergic reaction to pegfilgrastim, filgrastim, other medicines, foods, dyes, or preservatives -pregnant or trying to get pregnant -breast-feeding How should I use this medicine? This medicine is for injection under the skin. If you get this medicine at home, you will be taught how to prepare and give the pre-filled syringe or how to use the On-body Injector. Refer to the patient Instructions for Use for detailed instructions. Use exactly as directed. Take your medicine at regular intervals. Do not take your medicine more often than directed. It is important that you put your used needles and syringes in a special sharps container. Do not put them in a trash can. If  you do not have a sharps container, call your pharmacist or healthcare provider to get one. Talk to your pediatrician regarding the use of this medicine in children. Special care may be needed. Overdosage: If you think you have taken too much of this medicine contact a poison control center or emergency room at once. NOTE: This medicine is only for you. Do not share this medicine with others. What if I miss a dose? It is important not to miss your dose. Call your doctor or health care professional if you miss your dose. If you miss a dose due to an On-body Injector failure or leakage, a new dose should be administered as soon as possible using a single prefilled syringe for manual use. What may interact with this medicine? Interactions have not been studied. Give your health care provider a list of all the medicines, herbs, non-prescription drugs, or dietary supplements you use. Also tell them if you smoke, drink alcohol, or use illegal drugs. Some items may interact with your medicine. This list may not describe all possible interactions. Give your health care provider a list of all the medicines, herbs, non-prescription drugs, or dietary supplements you use. Also tell them if you smoke, drink alcohol, or use illegal drugs. Some items may interact with your medicine. What should I watch for while using this medicine? You may need blood work done while you are taking this medicine. If you are going to need a MRI, CT scan, or other procedure, tell your doctor that you are using this medicine (On-Body Injector only). What side effects may I notice from receiving this medicine? Side effects that you should report to your doctor or health care professional as soon as possible: -allergic reactions  like skin rash, itching or hives, swelling of the face, lips, or tongue -dizziness -fever -pain, redness, or irritation at site where injected -pinpoint red spots on the skin -shortness of breath or  breathing problems -stomach or side pain, or pain at the shoulder -swelling -tiredness -trouble passing urine Side effects that usually do not require medical attention (report to your doctor or health care professional if they continue or are bothersome): -bone pain -muscle pain This list may not describe all possible side effects. Call your doctor for medical advice about side effects. You may report side effects to FDA at 1-800-FDA-1088. Where should I keep my medicine? Keep out of the reach of children. Store pre-filled syringes in a refrigerator between 2 and 8 degrees C (36 and 46 degrees F). Do not freeze. Keep in carton to protect from light. Throw away this medicine if it is left out of the refrigerator for more than 48 hours. Throw away any unused medicine after the expiration date. NOTE: This sheet is a summary. It may not cover all possible information. If you have questions about this medicine, talk to your doctor, pharmacist, or health care provider.  2015, Elsevier/Gold Standard. (2013-09-05 16:14:05)    If you develop nausea and vomiting that is not controlled by your nausea medication, call the clinic.   BELOW ARE SYMPTOMS THAT SHOULD BE REPORTED IMMEDIATELY:  *FEVER GREATER THAN 100.5 F  *CHILLS WITH OR WITHOUT FEVER  NAUSEA AND VOMITING THAT IS NOT CONTROLLED WITH YOUR NAUSEA MEDICATION  *UNUSUAL SHORTNESS OF BREATH  *UNUSUAL BRUISING OR BLEEDING  TENDERNESS IN MOUTH AND THROAT WITH OR WITHOUT PRESENCE OF ULCERS  *URINARY PROBLEMS  *BOWEL PROBLEMS  UNUSUAL RASH Items with * indicate a potential emergency and should be followed up as soon as possible.  Feel free to call the clinic you have any questions or concerns. The clinic phone number is [phone].   [place] Discharge Instructions for Patients Receiving Chemotherapy  Today you received the following chemotherapy agents Oxaliplition, Leucovorin, and 5FU  To help prevent  nausea and vomiting after your treatment, we encourage you to take your nausea medication Compazine 10 mg every 6 hours as needed   If you develop nausea and vomiting that is not controlled by your nausea medication, call the clinic.   BELOW ARE SYMPTOMS THAT SHOULD BE REPORTED IMMEDIATELY:  *FEVER GREATER THAN 100.5 F  *CHILLS WITH OR WITHOUT FEVER  NAUSEA AND VOMITING THAT IS NOT CONTROLLED WITH YOUR NAUSEA MEDICATION  *UNUSUAL SHORTNESS OF BREATH  *UNUSUAL BRUISING OR BLEEDING  TENDERNESS IN MOUTH AND THROAT WITH OR WITHOUT PRESENCE OF ULCERS  *URINARY PROBLEMS  *BOWEL PROBLEMS  UNUSUAL RASH Items with * indicate a potential emergency and should be followed up as soon as possible.  Feel free to call the clinic you have any questions or concerns. The clinic phone number is [phone].

## 2014-01-15 NOTE — Progress Notes (Signed)
  [place] [facility] OFFICE PROGRESS NOTE   Diagnosis: Colon cancer  INTERVAL HISTORY:   Rick Davenport returns as scheduled. He feels well. He completed another cycle of FOLFOX 12/30/2013. No nausea, diarrhea, or neuropathy symptoms. No complaint.  Objective:  Vital signs in last 24 hours:  Blood pressure 110/81, pulse 76, temperature 98.1 F (36.7 C), temperature source Oral, resp. rate 18, height 6' (1.829 m), weight 186 lb 14.4 oz (84.777 kg).    HEENT: No thrush or ulcers Resp: Lungs clear bilaterally Cardio: Regular rate and rhythm GI: No hepatomegaly, nontender Vascular: No leg edema Neuro: Very mild decrease in vibratory sense at a few of the fingertips bilaterally    Portacath/PICC-without erythema  Lab Results:  Pending today  Medications: I have reviewed the patient's current medications.  Assessment/Plan: 1. Clinical stage IV (T4, N2b,M1a) adenocarcinoma of the cecum, status post a right colectomy 10/04/2013. No loss of mismatch repair protein expression, microsatellite stable  Omental nodule resected at the time of surgery 10/04/2013 with the pathology confirming a lymph node metastasis.  Chest CT 10/30/2013 with no definite signs of metastatic disease. 4 mm pulmonary nodule lateral segment of the right middle lobe.  Initiation of FOLFOX chemotherapy 11/18/2013. 2. Microcytic anemia-likely iron deficiency anemia. Improved. He continues oral iron. 3. History of tobacco use. 4. 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 Oneida Alar.  Disposition:  Mr. Eppinger has completed 4 cycles of FOLFOX. He continues to tolerate the chemotherapy well. The plan is to proceed with cycle 5 today. He will return for an office visit 01/31/2014  with the plan to complete cycle 6 on 02/03/2014.  Betsy Coder, MD  01/15/2014  9:42 AM

## 2014-01-15 NOTE — Telephone Encounter (Signed)
gv adn rpinted appt sched and avs for pt fro July adn Aug....sed added tx.

## 2014-01-15 NOTE — Patient Instructions (Signed)

## 2014-01-17 ENCOUNTER — Ambulatory Visit (HOSPITAL_BASED_OUTPATIENT_CLINIC_OR_DEPARTMENT_OTHER): Payer: Medicaid Other

## 2014-01-17 VITALS — BP 114/89 | HR 69

## 2014-01-17 DIAGNOSIS — Z5189 Encounter for other specified aftercare: Secondary | ICD-10-CM

## 2014-01-17 DIAGNOSIS — C180 Malignant neoplasm of cecum: Secondary | ICD-10-CM

## 2014-01-17 DIAGNOSIS — C772 Secondary and unspecified malignant neoplasm of intra-abdominal lymph nodes: Secondary | ICD-10-CM

## 2014-01-17 MED ORDER — HEPARIN SOD (PORK) LOCK FLUSH 100 UNIT/ML IV SOLN
500.0000 [IU] | Freq: Once | INTRAVENOUS | Status: AC | PRN
  Administered 2014-01-17: 500 [IU]
  Filled 2014-01-17: qty 5

## 2014-01-17 MED ORDER — SODIUM CHLORIDE 0.9 % IJ SOLN
10.0000 mL | INTRAMUSCULAR | Status: DC | PRN
  Administered 2014-01-17: 10 mL
  Filled 2014-01-17: qty 10

## 2014-01-17 MED ORDER — PEGFILGRASTIM INJECTION 6 MG/0.6ML
6.0000 mg | Freq: Once | SUBCUTANEOUS | Status: AC
  Administered 2014-01-17: 6 mg via SUBCUTANEOUS
  Filled 2014-01-17: qty 0.6

## 2014-01-26 ENCOUNTER — Other Ambulatory Visit: Payer: Self-pay | Admitting: Oncology

## 2014-01-31 ENCOUNTER — Other Ambulatory Visit: Payer: Self-pay | Admitting: Nurse Practitioner

## 2014-01-31 ENCOUNTER — Ambulatory Visit (HOSPITAL_BASED_OUTPATIENT_CLINIC_OR_DEPARTMENT_OTHER): Payer: Medicaid Other | Admitting: Nurse Practitioner

## 2014-01-31 ENCOUNTER — Other Ambulatory Visit (HOSPITAL_BASED_OUTPATIENT_CLINIC_OR_DEPARTMENT_OTHER): Payer: Medicaid Other

## 2014-01-31 ENCOUNTER — Telehealth: Payer: Self-pay | Admitting: Oncology

## 2014-01-31 ENCOUNTER — Ambulatory Visit (HOSPITAL_BASED_OUTPATIENT_CLINIC_OR_DEPARTMENT_OTHER): Payer: Medicaid Other

## 2014-01-31 VITALS — BP 137/89 | HR 55 | Temp 97.9°F | Resp 18

## 2014-01-31 VITALS — Ht 72.0 in | Wt 189.1 lb

## 2014-01-31 DIAGNOSIS — Z95828 Presence of other vascular implants and grafts: Secondary | ICD-10-CM

## 2014-01-31 DIAGNOSIS — C180 Malignant neoplasm of cecum: Secondary | ICD-10-CM

## 2014-01-31 DIAGNOSIS — D709 Neutropenia, unspecified: Secondary | ICD-10-CM

## 2014-01-31 DIAGNOSIS — C786 Secondary malignant neoplasm of retroperitoneum and peritoneum: Secondary | ICD-10-CM

## 2014-01-31 DIAGNOSIS — D509 Iron deficiency anemia, unspecified: Secondary | ICD-10-CM

## 2014-01-31 DIAGNOSIS — C189 Malignant neoplasm of colon, unspecified: Secondary | ICD-10-CM

## 2014-01-31 DIAGNOSIS — C772 Secondary and unspecified malignant neoplasm of intra-abdominal lymph nodes: Secondary | ICD-10-CM

## 2014-01-31 LAB — COMPREHENSIVE METABOLIC PANEL (CC13)
ALT: 23 U/L (ref 0–55)
ANION GAP: 10 meq/L (ref 3–11)
AST: 38 U/L — ABNORMAL HIGH (ref 5–34)
Albumin: 3.5 g/dL (ref 3.5–5.0)
Alkaline Phosphatase: 102 U/L (ref 40–150)
BUN: 12.4 mg/dL (ref 7.0–26.0)
CALCIUM: 9 mg/dL (ref 8.4–10.4)
CHLORIDE: 109 meq/L (ref 98–109)
CO2: 21 mEq/L — ABNORMAL LOW (ref 22–29)
CREATININE: 0.9 mg/dL (ref 0.7–1.3)
GLUCOSE: 85 mg/dL (ref 70–140)
Potassium: 4.2 mEq/L (ref 3.5–5.1)
Sodium: 140 mEq/L (ref 136–145)
Total Bilirubin: 0.3 mg/dL (ref 0.20–1.20)
Total Protein: 6.5 g/dL (ref 6.4–8.3)

## 2014-01-31 LAB — CBC WITH DIFFERENTIAL/PLATELET
BASO%: 0.3 % (ref 0.0–2.0)
Basophils Absolute: 0 10*3/uL (ref 0.0–0.1)
EOS ABS: 0.2 10*3/uL (ref 0.0–0.5)
EOS%: 3.4 % (ref 0.0–7.0)
HCT: 41.6 % (ref 38.4–49.9)
HEMOGLOBIN: 13.3 g/dL (ref 13.0–17.1)
LYMPH#: 2.2 10*3/uL (ref 0.9–3.3)
LYMPH%: 32.2 % (ref 14.0–49.0)
MCH: 25.2 pg — ABNORMAL LOW (ref 27.2–33.4)
MCHC: 31.9 g/dL — AB (ref 32.0–36.0)
MCV: 78.9 fL — ABNORMAL LOW (ref 79.3–98.0)
MONO#: 0.8 10*3/uL (ref 0.1–0.9)
MONO%: 11.8 % (ref 0.0–14.0)
NEUT#: 3.6 10*3/uL (ref 1.5–6.5)
NEUT%: 52.3 % (ref 39.0–75.0)
PLATELETS: 119 10*3/uL — AB (ref 140–400)
RBC: 5.28 10*6/uL (ref 4.20–5.82)
RDW: 22.7 % — ABNORMAL HIGH (ref 11.0–14.6)
WBC: 7 10*3/uL (ref 4.0–10.3)

## 2014-01-31 MED ORDER — HEPARIN SOD (PORK) LOCK FLUSH 100 UNIT/ML IV SOLN
500.0000 [IU] | Freq: Once | INTRAVENOUS | Status: AC
  Administered 2014-01-31: 500 [IU] via INTRAVENOUS
  Filled 2014-01-31: qty 5

## 2014-01-31 MED ORDER — SODIUM CHLORIDE 0.9 % IJ SOLN
10.0000 mL | INTRAMUSCULAR | Status: DC | PRN
  Administered 2014-01-31: 10 mL via INTRAVENOUS
  Filled 2014-01-31: qty 10

## 2014-01-31 NOTE — Patient Instructions (Signed)

## 2014-01-31 NOTE — Progress Notes (Signed)
  [facility] OFFICE PROGRESS NOTE   Diagnosis:  Colon cancer.  INTERVAL HISTORY:   Mr. Lizak returns as scheduled. He completed cycle 5 FOLFOX on 01/15/2014. He denies nausea/vomiting. No mouth sores. No diarrhea. He avoids cold for approximately 7 days following each treatment. He denies any numbness or tingling in his hands or feet. No fever, cough or shortness of breath. He denies pain. He has a good appetite. He continues to gain weight.  Objective:  Vital signs in last 24 hours:  Height 6' (1.829 m), weight 189 lb 1.6 oz (85.775 kg).    HEENT: No thrush or ulcers. Resp: Lungs clear bilaterally. Cardio: Regular rate and rhythm. GI: Abdomen soft and nontender. No hepatomegaly. Vascular: No leg edema. Neuro: Vibratory sense intact over the fingertips per tuning fork exam.   Port-A-Cath site without erythema.    Lab Results:  Lab Results  Component Value Date   WBC 7.0 01/31/2014   HGB 13.3 01/31/2014   HCT 41.6 01/31/2014   MCV 78.9* 01/31/2014   PLT 119* 01/31/2014   NEUTROABS 3.6 01/31/2014    Imaging:  No results found.  Medications: I have reviewed the patient's current medications.  Assessment/Plan: 1. Clinical stage IV (T4, N2b,M1a) adenocarcinoma of the cecum, status post a right colectomy 10/04/2013. No loss of mismatch repair protein expression, microsatellite stable  Omental nodule resected at the time of surgery 10/04/2013 with the pathology confirming a lymph node metastasis.  Chest CT 10/30/2013 with no definite signs of metastatic disease. 4 mm pulmonary nodule lateral segment of the right middle lobe.  Initiation of FOLFOX chemotherapy 11/18/2013. 2. Microcytic anemia-likely iron deficiency anemia. Improved. He continues oral iron. 3. History of tobacco use. 4. 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 Oneida Alar. 5. Mild neutropenia 01/15/2014. Neulasta added beginning with cycle 5.   Disposition:  Rick Davenport appears stable. He has completed 5 cycles of FOLFOX. He continues to tolerate treatment well. He will return to complete cycle 6 on 02/03/2014. We will see him in followup on 02/17/2014. He will contact the office in the interim with any problems.    [name] ANP/GNP-BC   01/31/2014  9:43 AM

## 2014-01-31 NOTE — Telephone Encounter (Signed)
gv and printed appt scehd adna vs for pt for Aug adn SEpt....sed added tx.

## 2014-02-03 ENCOUNTER — Ambulatory Visit (HOSPITAL_BASED_OUTPATIENT_CLINIC_OR_DEPARTMENT_OTHER): Payer: Medicaid Other

## 2014-02-03 VITALS — BP 120/85 | HR 66 | Temp 98.1°F | Resp 16

## 2014-02-03 DIAGNOSIS — Z5111 Encounter for antineoplastic chemotherapy: Secondary | ICD-10-CM

## 2014-02-03 DIAGNOSIS — C180 Malignant neoplasm of cecum: Secondary | ICD-10-CM

## 2014-02-03 DIAGNOSIS — C772 Secondary and unspecified malignant neoplasm of intra-abdominal lymph nodes: Secondary | ICD-10-CM

## 2014-02-03 MED ORDER — DEXAMETHASONE SODIUM PHOSPHATE 10 MG/ML IJ SOLN
INTRAMUSCULAR | Status: AC
  Filled 2014-02-03: qty 1

## 2014-02-03 MED ORDER — SODIUM CHLORIDE 0.9 % IJ SOLN
10.0000 mL | INTRAMUSCULAR | Status: DC | PRN
  Filled 2014-02-03: qty 10

## 2014-02-03 MED ORDER — FLUOROURACIL CHEMO INJECTION 2.5 GM/50ML
400.0000 mg/m2 | Freq: Once | INTRAVENOUS | Status: AC
  Administered 2014-02-03: 800 mg via INTRAVENOUS
  Filled 2014-02-03: qty 16

## 2014-02-03 MED ORDER — FLUOROURACIL CHEMO INJECTION 5 GM/100ML
2400.0000 mg/m2 | INTRAVENOUS | Status: DC
  Administered 2014-02-03: 4750 mg via INTRAVENOUS
  Filled 2014-02-03: qty 95

## 2014-02-03 MED ORDER — HEPARIN SOD (PORK) LOCK FLUSH 100 UNIT/ML IV SOLN
500.0000 [IU] | Freq: Once | INTRAVENOUS | Status: DC | PRN
  Filled 2014-02-03: qty 5

## 2014-02-03 MED ORDER — DEXTROSE 5 % IV SOLN
Freq: Once | INTRAVENOUS | Status: AC
  Administered 2014-02-03: 10:00:00 via INTRAVENOUS

## 2014-02-03 MED ORDER — DEXAMETHASONE SODIUM PHOSPHATE 10 MG/ML IJ SOLN
10.0000 mg | Freq: Once | INTRAMUSCULAR | Status: AC
  Administered 2014-02-03: 10 mg via INTRAVENOUS

## 2014-02-03 MED ORDER — LEUCOVORIN CALCIUM INJECTION 350 MG
400.0000 mg/m2 | Freq: Once | INTRAVENOUS | Status: AC
  Administered 2014-02-03: 800 mg via INTRAVENOUS
  Filled 2014-02-03: qty 40

## 2014-02-03 MED ORDER — ONDANSETRON 8 MG/50ML IVPB (CHCC)
8.0000 mg | Freq: Once | INTRAVENOUS | Status: AC
  Administered 2014-02-03: 8 mg via INTRAVENOUS

## 2014-02-03 MED ORDER — ONDANSETRON 8 MG/NS 50 ML IVPB
INTRAVENOUS | Status: AC
  Filled 2014-02-03: qty 8

## 2014-02-03 MED ORDER — DEXTROSE 5 % IV SOLN
85.0000 mg/m2 | Freq: Once | INTRAVENOUS | Status: AC
  Administered 2014-02-03: 165 mg via INTRAVENOUS
  Filled 2014-02-03: qty 33

## 2014-02-03 NOTE — Progress Notes (Signed)
Per Dr. Benay Spice OK to treat on 02/03/14 per 01/31/14 labs.

## 2014-02-03 NOTE — Patient Instructions (Signed)
[facility] Discharge Instructions for Patients Receiving Chemotherapy  Today you received the following chemotherapy agents: Oxaliplatin, Leucovorin, 5FU   To help prevent nausea and vomiting after your treatment, we encourage you to take your nausea medication as prescribed.    If you develop nausea and vomiting that is not controlled by your nausea medication, call the clinic.   BELOW ARE SYMPTOMS THAT SHOULD BE REPORTED IMMEDIATELY:  *FEVER GREATER THAN 100.5 F  *CHILLS WITH OR WITHOUT FEVER  NAUSEA AND VOMITING THAT IS NOT CONTROLLED WITH YOUR NAUSEA MEDICATION  *UNUSUAL SHORTNESS OF BREATH  *UNUSUAL BRUISING OR BLEEDING  TENDERNESS IN MOUTH AND THROAT WITH OR WITHOUT PRESENCE OF ULCERS  *URINARY PROBLEMS  *BOWEL PROBLEMS  UNUSUAL RASH Items with * indicate a potential emergency and should be followed up as soon as possible.  Feel free to call the clinic you have any questions or concerns. The clinic phone number is [phone].    

## 2014-02-05 ENCOUNTER — Ambulatory Visit (HOSPITAL_BASED_OUTPATIENT_CLINIC_OR_DEPARTMENT_OTHER): Payer: Medicaid Other

## 2014-02-05 VITALS — BP 92/63 | HR 104 | Temp 98.7°F

## 2014-02-05 DIAGNOSIS — Z5189 Encounter for other specified aftercare: Secondary | ICD-10-CM

## 2014-02-05 DIAGNOSIS — C180 Malignant neoplasm of cecum: Secondary | ICD-10-CM

## 2014-02-05 DIAGNOSIS — C772 Secondary and unspecified malignant neoplasm of intra-abdominal lymph nodes: Secondary | ICD-10-CM

## 2014-02-05 MED ORDER — SODIUM CHLORIDE 0.9 % IJ SOLN
10.0000 mL | INTRAMUSCULAR | Status: DC | PRN
  Administered 2014-02-05: 10 mL
  Filled 2014-02-05: qty 10

## 2014-02-05 MED ORDER — PEGFILGRASTIM INJECTION 6 MG/0.6ML
6.0000 mg | Freq: Once | SUBCUTANEOUS | Status: AC
  Administered 2014-02-05: 6 mg via SUBCUTANEOUS
  Filled 2014-02-05: qty 0.6

## 2014-02-05 MED ORDER — HEPARIN SOD (PORK) LOCK FLUSH 100 UNIT/ML IV SOLN
500.0000 [IU] | Freq: Once | INTRAVENOUS | Status: AC | PRN
  Administered 2014-02-05: 500 [IU]
  Filled 2014-02-05: qty 5

## 2014-02-05 NOTE — Patient Instructions (Signed)
Pegfilgrastim injection What is this medicine? PEGFILGRASTIM (peg fil GRA stim) is a long-acting granulocyte colony-stimulating factor that stimulates the growth of neutrophils, a type of white blood cell important in the body's fight against infection. It is used to reduce the incidence of fever and infection in patients with certain types of cancer who are receiving chemotherapy that affects the bone marrow. This medicine may be used for other purposes; ask your health care provider or pharmacist if you have questions. COMMON BRAND NAME(S): Neulasta What should I tell my health care provider before I take this medicine? They need to know if you have any of these conditions: -latex allergy -ongoing radiation therapy -sickle cell disease -skin reactions to acrylic adhesives (On-Body Injector only) -an unusual or allergic reaction to pegfilgrastim, filgrastim, other medicines, foods, dyes, or preservatives -pregnant or trying to get pregnant -breast-feeding How should I use this medicine? This medicine is for injection under the skin. If you get this medicine at home, you will be taught how to prepare and give the pre-filled syringe or how to use the On-body Injector. Refer to the patient Instructions for Use for detailed instructions. Use exactly as directed. Take your medicine at regular intervals. Do not take your medicine more often than directed. It is important that you put your used needles and syringes in a special sharps container. Do not put them in a trash can. If you do not have a sharps container, call your pharmacist or healthcare provider to get one. Talk to your pediatrician regarding the use of this medicine in children. Special care may be needed. Overdosage: If you think you have taken too much of this medicine contact a poison control center or emergency room at once. NOTE: This medicine is only for you. Do not share this medicine with others. What if I miss a dose? It is  important not to miss your dose. Call your doctor or health care professional if you miss your dose. If you miss a dose due to an On-body Injector failure or leakage, a new dose should be administered as soon as possible using a single prefilled syringe for manual use. What may interact with this medicine? Interactions have not been studied. Give your health care provider a list of all the medicines, herbs, non-prescription drugs, or dietary supplements you use. Also tell them if you smoke, drink alcohol, or use illegal drugs. Some items may interact with your medicine. This list may not describe all possible interactions. Give your health care provider a list of all the medicines, herbs, non-prescription drugs, or dietary supplements you use. Also tell them if you smoke, drink alcohol, or use illegal drugs. Some items may interact with your medicine. What should I watch for while using this medicine? You may need blood work done while you are taking this medicine. If you are going to need a MRI, CT scan, or other procedure, tell your doctor that you are using this medicine (On-Body Injector only). What side effects may I notice from receiving this medicine? Side effects that you should report to your doctor or health care professional as soon as possible: -allergic reactions like skin rash, itching or hives, swelling of the face, lips, or tongue -dizziness -fever -pain, redness, or irritation at site where injected -pinpoint red spots on the skin -shortness of breath or breathing problems -stomach or side pain, or pain at the shoulder -swelling -tiredness -trouble passing urine Side effects that usually do not require medical attention (report to your doctor   or health care professional if they continue or are bothersome): -bone pain -muscle pain This list may not describe all possible side effects. Call your doctor for medical advice about side effects. You may report side effects to FDA at  1-800-FDA-1088. Where should I keep my medicine? Keep out of the reach of children. Store pre-filled syringes in a refrigerator between 2 and 8 degrees C (36 and 46 degrees F). Do not freeze. Keep in carton to protect from light. Throw away this medicine if it is left out of the refrigerator for more than 48 hours. Throw away any unused medicine after the expiration date. NOTE: This sheet is a summary. It may not cover all possible information. If you have questions about this medicine, talk to your doctor, pharmacist, or health care provider.  2015, Elsevier/[employer] Standard. (2013-09-05 16:14:05) Implanted Port Home Guide An implanted port is a type of central line that is placed under the skin. Central lines are used to provide IV access when treatment or nutrition needs to be given through a person's veins. Implanted ports are used for long-term IV access. An implanted port may be placed because:   You need IV medicine that would be irritating to the small veins in your hands or arms.   You need long-term IV medicines, such as antibiotics.   You need IV nutrition for a long period.   You need frequent blood draws for lab tests.   You need dialysis.  Implanted ports are usually placed in the chest area, but they can also be placed in the upper arm, the abdomen, or the leg. An implanted port has two main parts:   Reservoir. The reservoir is round and will appear as a small, raised area under your skin. The reservoir is the part where a needle is inserted to give medicines or draw blood.   Catheter. The catheter is a thin, flexible tube that extends from the reservoir. The catheter is placed into a large vein. Medicine that is inserted into the reservoir goes into the catheter and then into the vein.  HOW WILL I CARE FOR MY INCISION SITE? Do not get the incision site wet. Bathe or shower as directed by your health care provider.  HOW IS MY PORT ACCESSED? Special steps must be taken to  access the port:   Before the port is accessed, a numbing cream can be placed on the skin. This helps numb the skin over the port site.   Your health care provider uses a sterile technique to access the port.  Your health care provider must put on a mask and sterile gloves.  The skin over your port is cleaned carefully with an antiseptic and allowed to dry.  The port is gently pinched between sterile gloves, and a needle is inserted into the port.  Only "non-coring" port needles should be used to access the port. Once the port is accessed, a blood return should be checked. This helps ensure that the port is in the vein and is not clogged.   If your port needs to remain accessed for a constant infusion, a clear (transparent) bandage will be placed over the needle site. The bandage and needle will need to be changed every week, or as directed by your health care provider.   Keep the bandage covering the needle clean and dry. Do not get it wet. Follow your health care provider's instructions on how to take a shower or bath while the port is accessed.     If your port does not need to stay accessed, no bandage is needed over the port.  WHAT IS FLUSHING? Flushing helps keep the port from getting clogged. Follow your health care provider's instructions on how and when to flush the port. Ports are usually flushed with saline solution or a medicine called heparin. The need for flushing will depend on how the port is used.   If the port is used for intermittent medicines or blood draws, the port will need to be flushed:   After medicines have been given.   After blood has been drawn.   As part of routine maintenance.   If a constant infusion is running, the port may not need to be flushed.  HOW LONG WILL MY PORT STAY IMPLANTED? The port can stay in for as long as your health care provider thinks it is needed. When it is time for the port to come out, surgery will be done to remove it.  The procedure is similar to the one performed when the port was put in.  WHEN SHOULD I SEEK IMMEDIATE MEDICAL CARE? When you have an implanted port, you should seek immediate medical care if:   You notice a bad smell coming from the incision site.   You have swelling, redness, or drainage at the incision site.   You have more swelling or pain at the port site or the surrounding area.   You have a fever that is not controlled with medicine. Document Released: 06/06/2005 Document Revised: 03/27/2013 Document Reviewed: 02/11/2013 ExitCare Patient Information 2015 [employer]. This information is not intended to replace advice given to you by your health care provider. Make sure you discuss any questions you have with your health care provider.  

## 2014-02-14 ENCOUNTER — Telehealth: Payer: Self-pay | Admitting: Oncology

## 2014-02-14 NOTE — Telephone Encounter (Signed)
Spk w/pt advising of NP/KC will be filling in for GBS and time/date will stay the same.Marland Kitchen..KJ

## 2014-02-16 ENCOUNTER — Other Ambulatory Visit: Payer: Self-pay | Admitting: Oncology

## 2014-02-17 ENCOUNTER — Ambulatory Visit (HOSPITAL_BASED_OUTPATIENT_CLINIC_OR_DEPARTMENT_OTHER): Payer: Medicaid Other

## 2014-02-17 ENCOUNTER — Other Ambulatory Visit (HOSPITAL_BASED_OUTPATIENT_CLINIC_OR_DEPARTMENT_OTHER): Payer: Medicaid Other

## 2014-02-17 ENCOUNTER — Encounter: Payer: Self-pay | Admitting: Oncology

## 2014-02-17 ENCOUNTER — Ambulatory Visit (HOSPITAL_BASED_OUTPATIENT_CLINIC_OR_DEPARTMENT_OTHER): Payer: Medicaid Other | Admitting: Oncology

## 2014-02-17 ENCOUNTER — Ambulatory Visit: Payer: Medicaid Other

## 2014-02-17 VITALS — BP 121/80 | HR 63 | Temp 98.0°F | Resp 20 | Ht 72.0 in | Wt 191.0 lb

## 2014-02-17 DIAGNOSIS — C189 Malignant neoplasm of colon, unspecified: Secondary | ICD-10-CM

## 2014-02-17 DIAGNOSIS — D509 Iron deficiency anemia, unspecified: Secondary | ICD-10-CM

## 2014-02-17 DIAGNOSIS — C779 Secondary and unspecified malignant neoplasm of lymph node, unspecified: Secondary | ICD-10-CM

## 2014-02-17 DIAGNOSIS — C180 Malignant neoplasm of cecum: Secondary | ICD-10-CM

## 2014-02-17 DIAGNOSIS — Z95828 Presence of other vascular implants and grafts: Secondary | ICD-10-CM

## 2014-02-17 DIAGNOSIS — D709 Neutropenia, unspecified: Secondary | ICD-10-CM

## 2014-02-17 DIAGNOSIS — Z5111 Encounter for antineoplastic chemotherapy: Secondary | ICD-10-CM

## 2014-02-17 LAB — CBC WITH DIFFERENTIAL/PLATELET
BASO%: 0.5 % (ref 0.0–2.0)
BASOS ABS: 0 10*3/uL (ref 0.0–0.1)
EOS%: 3.2 % (ref 0.0–7.0)
Eosinophils Absolute: 0.2 10*3/uL (ref 0.0–0.5)
HCT: 38.7 % (ref 38.4–49.9)
HEMOGLOBIN: 12.5 g/dL — AB (ref 13.0–17.1)
LYMPH#: 2.1 10*3/uL (ref 0.9–3.3)
LYMPH%: 34.2 % (ref 14.0–49.0)
MCH: 25.5 pg — AB (ref 27.2–33.4)
MCHC: 32.3 g/dL (ref 32.0–36.0)
MCV: 78.8 fL — ABNORMAL LOW (ref 79.3–98.0)
MONO#: 0.8 10*3/uL (ref 0.1–0.9)
MONO%: 13.8 % (ref 0.0–14.0)
NEUT#: 2.9 10*3/uL (ref 1.5–6.5)
NEUT%: 48.3 % (ref 39.0–75.0)
PLATELETS: 139 10*3/uL — AB (ref 140–400)
RBC: 4.91 10*6/uL (ref 4.20–5.82)
RDW: 19.6 % — ABNORMAL HIGH (ref 11.0–14.6)
WBC: 6 10*3/uL (ref 4.0–10.3)

## 2014-02-17 LAB — COMPREHENSIVE METABOLIC PANEL (CC13)
ALT: 22 U/L (ref 0–55)
ANION GAP: 7 meq/L (ref 3–11)
AST: 29 U/L (ref 5–34)
Albumin: 3.5 g/dL (ref 3.5–5.0)
Alkaline Phosphatase: 115 U/L (ref 40–150)
BILIRUBIN TOTAL: 0.36 mg/dL (ref 0.20–1.20)
BUN: 10.4 mg/dL (ref 7.0–26.0)
CALCIUM: 8.7 mg/dL (ref 8.4–10.4)
CHLORIDE: 111 meq/L — AB (ref 98–109)
CO2: 24 meq/L (ref 22–29)
Creatinine: 0.9 mg/dL (ref 0.7–1.3)
Glucose: 77 mg/dl (ref 70–140)
Potassium: 4.1 mEq/L (ref 3.5–5.1)
Sodium: 142 mEq/L (ref 136–145)
Total Protein: 6.3 g/dL — ABNORMAL LOW (ref 6.4–8.3)

## 2014-02-17 MED ORDER — LEUCOVORIN CALCIUM INJECTION 350 MG
400.0000 mg/m2 | Freq: Once | INTRAMUSCULAR | Status: AC
  Administered 2014-02-17: 800 mg via INTRAVENOUS
  Filled 2014-02-17: qty 40

## 2014-02-17 MED ORDER — DEXAMETHASONE SODIUM PHOSPHATE 10 MG/ML IJ SOLN
INTRAMUSCULAR | Status: AC
  Filled 2014-02-17: qty 1

## 2014-02-17 MED ORDER — DEXTROSE 5 % IV SOLN
Freq: Once | INTRAVENOUS | Status: AC
  Administered 2014-02-17: 11:00:00 via INTRAVENOUS

## 2014-02-17 MED ORDER — OXALIPLATIN CHEMO INJECTION 100 MG/20ML
85.0000 mg/m2 | Freq: Once | INTRAVENOUS | Status: AC
  Administered 2014-02-17: 165 mg via INTRAVENOUS
  Filled 2014-02-17: qty 33

## 2014-02-17 MED ORDER — FLUOROURACIL CHEMO INJECTION 2.5 GM/50ML
400.0000 mg/m2 | Freq: Once | INTRAVENOUS | Status: AC
  Administered 2014-02-17: 800 mg via INTRAVENOUS
  Filled 2014-02-17: qty 16

## 2014-02-17 MED ORDER — DEXAMETHASONE SODIUM PHOSPHATE 10 MG/ML IJ SOLN
10.0000 mg | Freq: Once | INTRAMUSCULAR | Status: AC
  Administered 2014-02-17: 10 mg via INTRAVENOUS

## 2014-02-17 MED ORDER — ONDANSETRON 8 MG/50ML IVPB (CHCC)
8.0000 mg | Freq: Once | INTRAVENOUS | Status: AC
  Administered 2014-02-17: 8 mg via INTRAVENOUS

## 2014-02-17 MED ORDER — SODIUM CHLORIDE 0.9 % IJ SOLN
10.0000 mL | INTRAMUSCULAR | Status: DC | PRN
Start: 2014-02-17 — End: 2014-02-17
  Administered 2014-02-17: 10 mL via INTRAVENOUS
  Filled 2014-02-17: qty 10

## 2014-02-17 MED ORDER — ONDANSETRON 8 MG/NS 50 ML IVPB
INTRAVENOUS | Status: AC
  Filled 2014-02-17: qty 8

## 2014-02-17 MED ORDER — SODIUM CHLORIDE 0.9 % IV SOLN
2400.0000 mg/m2 | INTRAVENOUS | Status: DC
  Administered 2014-02-17: 4750 mg via INTRAVENOUS
  Filled 2014-02-17: qty 95

## 2014-02-17 NOTE — Progress Notes (Signed)
  [place] [facility] OFFICE PROGRESS NOTE   Diagnosis:  Colon cancer.  INTERVAL HISTORY:   Mr. Frye returns as scheduled. He is here for cycle 7 FOLFOX today. He denies nausea/vomiting. No mouth sores. No diarrhea. He avoids cold for approximately 7 days following each treatment. He denies any numbness or tingling in his hands or feet. No fever, cough or shortness of breath. He denies pain. He has a good appetite. He continues to gain weight.  Objective:  Vital signs in last 24 hours:  Blood pressure 121/80, pulse 63, temperature 98 F (36.7 C), temperature source Oral, resp. rate 20, height 6' (1.829 m), weight 191 lb (86.637 kg).    HEENT: No thrush or ulcers. Resp: Lungs clear bilaterally. Cardio: Regular rate and rhythm. GI: Abdomen soft and nontender. No hepatomegaly. Vascular: No leg edema. Neuro: Vibratory sense intact over the fingertips per tuning fork exam.   Port-A-Cath site without erythema.    Lab Results:  Lab Results  Component Value Date   WBC 6.0 02/17/2014   HGB 12.5* 02/17/2014   HCT 38.7 02/17/2014   MCV 78.8* 02/17/2014   PLT 139* 02/17/2014   NEUTROABS 2.9 02/17/2014    Imaging:  No results found.  Medications: I have reviewed the patient's current medications.  Assessment/Plan: 1. Clinical stage IV (T4, N2b,M1a) adenocarcinoma of the cecum, status post a right colectomy 10/04/2013. No loss of mismatch repair protein expression, microsatellite stable  Omental nodule resected at the time of surgery 10/04/2013 with the pathology confirming a lymph node metastasis.  Chest CT 10/30/2013 with no definite signs of metastatic disease. 4 mm pulmonary nodule lateral segment of the right middle lobe.  Initiation of FOLFOX chemotherapy 11/18/2013. 2. Microcytic anemia-likely iron deficiency anemia. Stable. He continues oral iron. 3. History of tobacco use. 4. 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  Oneida Alar. 5. Mild neutropenia 01/15/2014. Neulasta added beginning with cycle 5.   Disposition: Mr. Dibble appears stable. He has completed 6 cycles of FOLFOX. He continues to tolerate treatment well. Recommend that he proceed with cycle 7 of FOLFOX today. He will return on 03/03/14 for labs and a visit prior to cycle 8. He will contact the office in the interim with any problems.    Mikey Bussing, DNP, AGPCNP-BC   02/17/2014  10:22 AM

## 2014-02-17 NOTE — Patient Instructions (Signed)

## 2014-02-17 NOTE — Patient Instructions (Signed)
[facility] Discharge Instructions for Patients Receiving Chemotherapy  Today you received the following chemotherapy agents; Oxaliplatin, Leucovorin and 5-FU.   To help prevent nausea and vomiting after your treatment, we encourage you to take your nausea medication as directed.    If you develop nausea and vomiting that is not controlled by your nausea medication, call the clinic.   BELOW ARE SYMPTOMS THAT SHOULD BE REPORTED IMMEDIATELY:  *FEVER GREATER THAN 100.5 F  *CHILLS WITH OR WITHOUT FEVER  NAUSEA AND VOMITING THAT IS NOT CONTROLLED WITH YOUR NAUSEA MEDICATION  *UNUSUAL SHORTNESS OF BREATH  *UNUSUAL BRUISING OR BLEEDING  TENDERNESS IN MOUTH AND THROAT WITH OR WITHOUT PRESENCE OF ULCERS  *URINARY PROBLEMS  *BOWEL PROBLEMS  UNUSUAL RASH Items with * indicate a potential emergency and should be followed up as soon as possible.  Feel free to call the clinic you have any questions or concerns. The clinic phone number is [phone].

## 2014-02-19 ENCOUNTER — Ambulatory Visit: Payer: Medicaid Other

## 2014-02-19 ENCOUNTER — Ambulatory Visit (HOSPITAL_BASED_OUTPATIENT_CLINIC_OR_DEPARTMENT_OTHER): Payer: Medicaid Other

## 2014-02-19 DIAGNOSIS — C180 Malignant neoplasm of cecum: Secondary | ICD-10-CM

## 2014-02-19 DIAGNOSIS — Z5189 Encounter for other specified aftercare: Secondary | ICD-10-CM

## 2014-02-19 DIAGNOSIS — C779 Secondary and unspecified malignant neoplasm of lymph node, unspecified: Secondary | ICD-10-CM

## 2014-02-19 MED ORDER — PEGFILGRASTIM INJECTION 6 MG/0.6ML
6.0000 mg | Freq: Once | SUBCUTANEOUS | Status: AC
  Administered 2014-02-19: 6 mg via SUBCUTANEOUS
  Filled 2014-02-19: qty 0.6

## 2014-02-19 MED ORDER — HEPARIN SOD (PORK) LOCK FLUSH 100 UNIT/ML IV SOLN
500.0000 [IU] | Freq: Once | INTRAVENOUS | Status: DC | PRN
  Administered 2014-02-19: 500 [IU]
  Filled 2014-02-19: qty 5

## 2014-02-19 MED ORDER — SODIUM CHLORIDE 0.9 % IJ SOLN
10.0000 mL | INTRAMUSCULAR | Status: DC | PRN
  Administered 2014-02-19: 10 mL
  Filled 2014-02-19: qty 10

## 2014-02-19 NOTE — Patient Instructions (Signed)
Pegfilgrastim injection What is this medicine? PEGFILGRASTIM (peg fil GRA stim) is a long-acting granulocyte colony-stimulating factor that stimulates the growth of neutrophils, a type of white blood cell important in the body's fight against infection. It is used to reduce the incidence of fever and infection in patients with certain types of cancer who are receiving chemotherapy that affects the bone marrow. This medicine may be used for other purposes; ask your health care provider or pharmacist if you have questions. COMMON BRAND NAME(S): Neulasta What should I tell my health care provider before I take this medicine? They need to know if you have any of these conditions: -latex allergy -ongoing radiation therapy -sickle cell disease -skin reactions to acrylic adhesives (On-Body Injector only) -an unusual or allergic reaction to pegfilgrastim, filgrastim, other medicines, foods, dyes, or preservatives -pregnant or trying to get pregnant -breast-feeding How should I use this medicine? This medicine is for injection under the skin. If you get this medicine at home, you will be taught how to prepare and give the pre-filled syringe or how to use the On-body Injector. Refer to the patient Instructions for Use for detailed instructions. Use exactly as directed. Take your medicine at regular intervals. Do not take your medicine more often than directed. It is important that you put your used needles and syringes in a special sharps container. Do not put them in a trash can. If you do not have a sharps container, call your pharmacist or healthcare provider to get one. Talk to your pediatrician regarding the use of this medicine in children. Special care may be needed. Overdosage: If you think you have taken too much of this medicine contact a poison control center or emergency room at once. NOTE: This medicine is only for you. Do not share this medicine with others. What if I miss a dose? It is  important not to miss your dose. Call your doctor or health care professional if you miss your dose. If you miss a dose due to an On-body Injector failure or leakage, a new dose should be administered as soon as possible using a single prefilled syringe for manual use. What may interact with this medicine? Interactions have not been studied. Give your health care provider a list of all the medicines, herbs, non-prescription drugs, or dietary supplements you use. Also tell them if you smoke, drink alcohol, or use illegal drugs. Some items may interact with your medicine. This list may not describe all possible interactions. Give your health care provider a list of all the medicines, herbs, non-prescription drugs, or dietary supplements you use. Also tell them if you smoke, drink alcohol, or use illegal drugs. Some items may interact with your medicine. What should I watch for while using this medicine? You may need blood work done while you are taking this medicine. If you are going to need a MRI, CT scan, or other procedure, tell your doctor that you are using this medicine (On-Body Injector only). What side effects may I notice from receiving this medicine? Side effects that you should report to your doctor or health care professional as soon as possible: -allergic reactions like skin rash, itching or hives, swelling of the face, lips, or tongue -dizziness -fever -pain, redness, or irritation at site where injected -pinpoint red spots on the skin -shortness of breath or breathing problems -stomach or side pain, or pain at the shoulder -swelling -tiredness -trouble passing urine Side effects that usually do not require medical attention (report to your doctor   or health care professional if they continue or are bothersome): -bone pain -muscle pain This list may not describe all possible side effects. Call your doctor for medical advice about side effects. You may report side effects to FDA at  1-800-FDA-1088. Where should I keep my medicine? Keep out of the reach of children. Store pre-filled syringes in a refrigerator between 2 and 8 degrees C (36 and 46 degrees F). Do not freeze. Keep in carton to protect from light. Throw away this medicine if it is left out of the refrigerator for more than 48 hours. Throw away any unused medicine after the expiration date. NOTE: This sheet is a summary. It may not cover all possible information. If you have questions about this medicine, talk to your doctor, pharmacist, or health care provider.  2015, Elsevier/Gold Standard. (2013-09-05 16:14:05)  

## 2014-02-19 NOTE — Patient Instructions (Signed)

## 2014-02-24 ENCOUNTER — Other Ambulatory Visit: Payer: Self-pay | Admitting: Oncology

## 2014-03-03 ENCOUNTER — Ambulatory Visit (HOSPITAL_BASED_OUTPATIENT_CLINIC_OR_DEPARTMENT_OTHER): Payer: Medicaid Other

## 2014-03-03 ENCOUNTER — Other Ambulatory Visit (HOSPITAL_BASED_OUTPATIENT_CLINIC_OR_DEPARTMENT_OTHER): Payer: Medicaid Other

## 2014-03-03 ENCOUNTER — Ambulatory Visit: Payer: Medicaid Other

## 2014-03-03 ENCOUNTER — Ambulatory Visit (HOSPITAL_BASED_OUTPATIENT_CLINIC_OR_DEPARTMENT_OTHER): Payer: Medicaid Other | Admitting: Nurse Practitioner

## 2014-03-03 ENCOUNTER — Telehealth: Payer: Self-pay | Admitting: Oncology

## 2014-03-03 VITALS — BP 116/79 | HR 54 | Temp 97.6°F | Resp 18 | Ht 72.0 in | Wt 190.3 lb

## 2014-03-03 DIAGNOSIS — C180 Malignant neoplasm of cecum: Secondary | ICD-10-CM

## 2014-03-03 DIAGNOSIS — C189 Malignant neoplasm of colon, unspecified: Secondary | ICD-10-CM

## 2014-03-03 DIAGNOSIS — R911 Solitary pulmonary nodule: Secondary | ICD-10-CM

## 2014-03-03 DIAGNOSIS — Z23 Encounter for immunization: Secondary | ICD-10-CM

## 2014-03-03 DIAGNOSIS — C772 Secondary and unspecified malignant neoplasm of intra-abdominal lymph nodes: Secondary | ICD-10-CM

## 2014-03-03 DIAGNOSIS — D509 Iron deficiency anemia, unspecified: Secondary | ICD-10-CM

## 2014-03-03 DIAGNOSIS — C786 Secondary malignant neoplasm of retroperitoneum and peritoneum: Secondary | ICD-10-CM

## 2014-03-03 DIAGNOSIS — Z95828 Presence of other vascular implants and grafts: Secondary | ICD-10-CM

## 2014-03-03 DIAGNOSIS — Z5111 Encounter for antineoplastic chemotherapy: Secondary | ICD-10-CM

## 2014-03-03 LAB — COMPREHENSIVE METABOLIC PANEL (CC13)
ALBUMIN: 3.6 g/dL (ref 3.5–5.0)
ALT: 13 U/L (ref 0–55)
ANION GAP: 9 meq/L (ref 3–11)
AST: 24 U/L (ref 5–34)
Alkaline Phosphatase: 120 U/L (ref 40–150)
BUN: 8.9 mg/dL (ref 7.0–26.0)
CALCIUM: 8.9 mg/dL (ref 8.4–10.4)
CO2: 22 mEq/L (ref 22–29)
CREATININE: 0.9 mg/dL (ref 0.7–1.3)
Chloride: 108 mEq/L (ref 98–109)
GLUCOSE: 89 mg/dL (ref 70–140)
Potassium: 4.4 mEq/L (ref 3.5–5.1)
SODIUM: 139 meq/L (ref 136–145)
Total Bilirubin: 0.23 mg/dL (ref 0.20–1.20)
Total Protein: 7 g/dL (ref 6.4–8.3)

## 2014-03-03 LAB — CBC WITH DIFFERENTIAL/PLATELET
BASO%: 1.3 % (ref 0.0–2.0)
Basophils Absolute: 0.1 10*3/uL (ref 0.0–0.1)
EOS ABS: 0.2 10*3/uL (ref 0.0–0.5)
EOS%: 4.4 % (ref 0.0–7.0)
HCT: 40.8 % (ref 38.4–49.9)
HGB: 13 g/dL (ref 13.0–17.1)
LYMPH%: 37.1 % (ref 14.0–49.0)
MCH: 25.5 pg — ABNORMAL LOW (ref 27.2–33.4)
MCHC: 31.9 g/dL — AB (ref 32.0–36.0)
MCV: 80.2 fL (ref 79.3–98.0)
MONO#: 1 10*3/uL — ABNORMAL HIGH (ref 0.1–0.9)
MONO%: 19.8 % — AB (ref 0.0–14.0)
NEUT#: 1.9 10*3/uL (ref 1.5–6.5)
NEUT%: 37.4 % — ABNORMAL LOW (ref 39.0–75.0)
PLATELETS: 149 10*3/uL (ref 140–400)
RBC: 5.09 10*6/uL (ref 4.20–5.82)
RDW: 18.7 % — ABNORMAL HIGH (ref 11.0–14.6)
WBC: 5.1 10*3/uL (ref 4.0–10.3)
lymph#: 1.9 10*3/uL (ref 0.9–3.3)

## 2014-03-03 MED ORDER — DEXAMETHASONE SODIUM PHOSPHATE 10 MG/ML IJ SOLN
INTRAMUSCULAR | Status: AC
  Filled 2014-03-03: qty 1

## 2014-03-03 MED ORDER — DEXTROSE 5 % IV SOLN
Freq: Once | INTRAVENOUS | Status: AC
Start: 2014-03-03 — End: 2014-03-03
  Administered 2014-03-03: 11:00:00 via INTRAVENOUS

## 2014-03-03 MED ORDER — ONDANSETRON 8 MG/50ML IVPB (CHCC)
8.0000 mg | Freq: Once | INTRAVENOUS | Status: AC
  Administered 2014-03-03: 8 mg via INTRAVENOUS

## 2014-03-03 MED ORDER — SODIUM CHLORIDE 0.9 % IV SOLN
2400.0000 mg/m2 | INTRAVENOUS | Status: DC
  Administered 2014-03-03: 4750 mg via INTRAVENOUS
  Filled 2014-03-03: qty 95

## 2014-03-03 MED ORDER — SODIUM CHLORIDE 0.9 % IJ SOLN
10.0000 mL | INTRAMUSCULAR | Status: DC | PRN
  Administered 2014-03-03: 10 mL via INTRAVENOUS
  Filled 2014-03-03: qty 10

## 2014-03-03 MED ORDER — FLUOROURACIL CHEMO INJECTION 2.5 GM/50ML
400.0000 mg/m2 | Freq: Once | INTRAVENOUS | Status: AC
  Administered 2014-03-03: 800 mg via INTRAVENOUS
  Filled 2014-03-03: qty 16

## 2014-03-03 MED ORDER — DEXAMETHASONE SODIUM PHOSPHATE 10 MG/ML IJ SOLN
10.0000 mg | Freq: Once | INTRAMUSCULAR | Status: AC
  Administered 2014-03-03: 10 mg via INTRAVENOUS

## 2014-03-03 MED ORDER — INFLUENZA VAC SPLIT QUAD 0.5 ML IM SUSY
0.5000 mL | PREFILLED_SYRINGE | Freq: Once | INTRAMUSCULAR | Status: AC
  Administered 2014-03-03: 0.5 mL via INTRAMUSCULAR
  Filled 2014-03-03: qty 0.5

## 2014-03-03 MED ORDER — ONDANSETRON 8 MG/NS 50 ML IVPB
INTRAVENOUS | Status: AC
  Filled 2014-03-03: qty 8

## 2014-03-03 MED ORDER — OXALIPLATIN CHEMO INJECTION 100 MG/20ML
85.0000 mg/m2 | Freq: Once | INTRAVENOUS | Status: AC
  Administered 2014-03-03: 165 mg via INTRAVENOUS
  Filled 2014-03-03: qty 33

## 2014-03-03 MED ORDER — LEUCOVORIN CALCIUM INJECTION 350 MG
400.0000 mg/m2 | Freq: Once | INTRAVENOUS | Status: AC
  Administered 2014-03-03: 800 mg via INTRAVENOUS
  Filled 2014-03-03: qty 40

## 2014-03-03 NOTE — Patient Instructions (Signed)
[facility] Discharge Instructions for Patients Receiving Chemotherapy  Today you received the following chemotherapy agents:  Oxaliplatin, Leucovorin and 5FU  To help prevent nausea and vomiting after your treatment, we encourage you to take your nausea medication as ordered per MD.   If you develop nausea and vomiting that is not controlled by your nausea medication, call the clinic.   BELOW ARE SYMPTOMS THAT SHOULD BE REPORTED IMMEDIATELY:  *FEVER GREATER THAN 100.5 F  *CHILLS WITH OR WITHOUT FEVER  NAUSEA AND VOMITING THAT IS NOT CONTROLLED WITH YOUR NAUSEA MEDICATION  *UNUSUAL SHORTNESS OF BREATH  *UNUSUAL BRUISING OR BLEEDING  TENDERNESS IN MOUTH AND THROAT WITH OR WITHOUT PRESENCE OF ULCERS  *URINARY PROBLEMS  *BOWEL PROBLEMS  UNUSUAL RASH Items with * indicate a potential emergency and should be followed up as soon as possible.  Feel free to call the clinic you have any questions or concerns. The clinic phone number is [phone].    

## 2014-03-03 NOTE — Progress Notes (Signed)
  [place] OFFICE PROGRESS NOTE   Diagnosis:  Colon cancer.  INTERVAL HISTORY:   Mr. Delay returns as scheduled. He completed cycle 7 FOLFOX on 02/17/2014. He denies nausea/vomiting. No mouth sores. No diarrhea. He estimates the cold sensitivity lasted about 5-7 days. No persistent neuropathy symptoms. He continues to have a good appetite. He denies pain.  Objective:  Vital signs in last 24 hours:  Blood pressure 116/79, pulse 54, temperature 97.6 F (36.4 C), temperature source Oral, resp. rate 18, height 6' (1.829 m), weight 190 lb 4.8 oz (86.32 kg).    HEENT: No thrush or ulcers. Resp: Lungs clear bilaterally. Cardio: Regular rate and rhythm. GI: Abdomen soft and nontender. No hepatomegaly. Vascular: No leg edema. Neuro: Vibratory sense intact over the fingertips per tuning fork exam.  Port-A-Cath site without erythema.    Lab Results:  Lab Results  Component Value Date   WBC 5.1 03/03/2014   HGB 13.0 03/03/2014   HCT 40.8 03/03/2014   MCV 80.2 03/03/2014   PLT 149 03/03/2014   NEUTROABS 1.9 03/03/2014    Imaging:  No results found.  Medications: I have reviewed the patient's current medications.  Assessment/Plan: 1. Clinical stage IV (T4, N2b,M1a) adenocarcinoma of the cecum, status post a right colectomy 10/04/2013. No loss of mismatch repair protein expression, microsatellite stable  Omental nodule resected at the time of surgery 10/04/2013 with the pathology confirming a lymph node metastasis.  Chest CT 10/30/2013 with no definite signs of metastatic disease. 4 mm pulmonary nodule lateral segment of the right middle lobe.  Initiation of FOLFOX chemotherapy 11/18/2013. 2. Microcytic anemia-likely iron deficiency anemia. Improved. He continues oral iron. 3. History of tobacco use. 4. 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 Oneida Alar. 5. Mild neutropenia 01/15/2014. Neulasta added beginning with cycle  5.   Disposition: Rick Davenport appears stable. He has completed 7 cycles of FOLFOX. Plan to proceed with cycle 8 today as scheduled. He will return for a followup visit and cycle 9 in 2 weeks. He will contact the office in the interim with any problems. He requests administration of the influenza vaccine today.  Plan reviewed with Dr. Benay Spice.    [name] ANP/GNP-BC   03/03/2014  10:39 AM

## 2014-03-03 NOTE — Telephone Encounter (Signed)
gv pt appt schedule for sept °

## 2014-03-03 NOTE — Patient Instructions (Signed)

## 2014-03-04 ENCOUNTER — Encounter: Payer: Self-pay | Admitting: Oncology

## 2014-03-04 NOTE — Progress Notes (Signed)
Put disability form on nurse's desk. °

## 2014-03-05 ENCOUNTER — Ambulatory Visit: Payer: Medicaid Other

## 2014-03-05 ENCOUNTER — Ambulatory Visit (HOSPITAL_BASED_OUTPATIENT_CLINIC_OR_DEPARTMENT_OTHER): Payer: Medicaid Other

## 2014-03-05 VITALS — BP 108/79 | HR 88 | Temp 98.0°F

## 2014-03-05 DIAGNOSIS — C772 Secondary and unspecified malignant neoplasm of intra-abdominal lymph nodes: Secondary | ICD-10-CM

## 2014-03-05 DIAGNOSIS — Z5189 Encounter for other specified aftercare: Secondary | ICD-10-CM

## 2014-03-05 DIAGNOSIS — C180 Malignant neoplasm of cecum: Secondary | ICD-10-CM

## 2014-03-05 MED ORDER — SODIUM CHLORIDE 0.9 % IJ SOLN
10.0000 mL | INTRAMUSCULAR | Status: DC | PRN
  Administered 2014-03-05: 10 mL
  Filled 2014-03-05: qty 10

## 2014-03-05 MED ORDER — HEPARIN SOD (PORK) LOCK FLUSH 100 UNIT/ML IV SOLN
500.0000 [IU] | Freq: Once | INTRAVENOUS | Status: AC | PRN
  Administered 2014-03-05: 500 [IU]
  Filled 2014-03-05: qty 5

## 2014-03-05 MED ORDER — PEGFILGRASTIM INJECTION 6 MG/0.6ML
6.0000 mg | Freq: Once | SUBCUTANEOUS | Status: AC
  Administered 2014-03-05: 6 mg via SUBCUTANEOUS
  Filled 2014-03-05: qty 0.6

## 2014-03-05 NOTE — Patient Instructions (Signed)
Pegfilgrastim injection What is this medicine? PEGFILGRASTIM (peg fil GRA stim) is a long-acting granulocyte colony-stimulating factor that stimulates the growth of neutrophils, a type of white blood cell important in the body's fight against infection. It is used to reduce the incidence of fever and infection in patients with certain types of cancer who are receiving chemotherapy that affects the bone marrow. This medicine may be used for other purposes; ask your health care provider or pharmacist if you have questions. COMMON BRAND NAME(S): Neulasta What should I tell my health care provider before I take this medicine? They need to know if you have any of these conditions: -latex allergy -ongoing radiation therapy -sickle cell disease -skin reactions to acrylic adhesives (On-Body Injector only) -an unusual or allergic reaction to pegfilgrastim, filgrastim, other medicines, foods, dyes, or preservatives -pregnant or trying to get pregnant -breast-feeding How should I use this medicine? This medicine is for injection under the skin. If you get this medicine at home, you will be taught how to prepare and give the pre-filled syringe or how to use the On-body Injector. Refer to the patient Instructions for Use for detailed instructions. Use exactly as directed. Take your medicine at regular intervals. Do not take your medicine more often than directed. It is important that you put your used needles and syringes in a special sharps container. Do not put them in a trash can. If you do not have a sharps container, call your pharmacist or healthcare provider to get one. Talk to your pediatrician regarding the use of this medicine in children. Special care may be needed. Overdosage: If you think you have taken too much of this medicine contact a poison control center or emergency room at once. NOTE: This medicine is only for you. Do not share this medicine with others. What if I miss a dose? It is  important not to miss your dose. Call your doctor or health care professional if you miss your dose. If you miss a dose due to an On-body Injector failure or leakage, a new dose should be administered as soon as possible using a single prefilled syringe for manual use. What may interact with this medicine? Interactions have not been studied. Give your health care provider a list of all the medicines, herbs, non-prescription drugs, or dietary supplements you use. Also tell them if you smoke, drink alcohol, or use illegal drugs. Some items may interact with your medicine. This list may not describe all possible interactions. Give your health care provider a list of all the medicines, herbs, non-prescription drugs, or dietary supplements you use. Also tell them if you smoke, drink alcohol, or use illegal drugs. Some items may interact with your medicine. What should I watch for while using this medicine? You may need blood work done while you are taking this medicine. If you are going to need a MRI, CT scan, or other procedure, tell your doctor that you are using this medicine (On-Body Injector only). What side effects may I notice from receiving this medicine? Side effects that you should report to your doctor or health care professional as soon as possible: -allergic reactions like skin rash, itching or hives, swelling of the face, lips, or tongue -dizziness -fever -pain, redness, or irritation at site where injected -pinpoint red spots on the skin -shortness of breath or breathing problems -stomach or side pain, or pain at the shoulder -swelling -tiredness -trouble passing urine Side effects that usually do not require medical attention (report to your doctor   or health care professional if they continue or are bothersome): -bone pain -muscle pain This list may not describe all possible side effects. Call your doctor for medical advice about side effects. You may report side effects to FDA at  1-800-FDA-1088. Where should I keep my medicine? Keep out of the reach of children. Store pre-filled syringes in a refrigerator between 2 and 8 degrees C (36 and 46 degrees F). Do not freeze. Keep in carton to protect from light. Throw away this medicine if it is left out of the refrigerator for more than 48 hours. Throw away any unused medicine after the expiration date. NOTE: This sheet is a summary. It may not cover all possible information. If you have questions about this medicine, talk to your doctor, pharmacist, or health care provider.  2015, Elsevier/Gold Standard. (2013-09-05 16:14:05)  

## 2014-03-07 ENCOUNTER — Encounter: Payer: Self-pay | Admitting: Oncology

## 2014-03-07 NOTE — Progress Notes (Signed)
Put disability form in registration desk. °

## 2014-03-16 ENCOUNTER — Other Ambulatory Visit: Payer: Self-pay | Admitting: Oncology

## 2014-03-17 ENCOUNTER — Telehealth: Payer: Self-pay | Admitting: Oncology

## 2014-03-17 ENCOUNTER — Ambulatory Visit (HOSPITAL_BASED_OUTPATIENT_CLINIC_OR_DEPARTMENT_OTHER): Payer: Medicaid Other | Admitting: Oncology

## 2014-03-17 ENCOUNTER — Other Ambulatory Visit (HOSPITAL_BASED_OUTPATIENT_CLINIC_OR_DEPARTMENT_OTHER): Payer: Medicaid Other

## 2014-03-17 ENCOUNTER — Ambulatory Visit (HOSPITAL_BASED_OUTPATIENT_CLINIC_OR_DEPARTMENT_OTHER): Payer: Medicaid Other

## 2014-03-17 ENCOUNTER — Telehealth: Payer: Self-pay | Admitting: *Deleted

## 2014-03-17 VITALS — BP 111/81 | HR 70 | Temp 98.0°F | Resp 18 | Ht 72.0 in | Wt 193.8 lb

## 2014-03-17 DIAGNOSIS — Z5111 Encounter for antineoplastic chemotherapy: Secondary | ICD-10-CM

## 2014-03-17 DIAGNOSIS — C772 Secondary and unspecified malignant neoplasm of intra-abdominal lymph nodes: Secondary | ICD-10-CM

## 2014-03-17 DIAGNOSIS — C180 Malignant neoplasm of cecum: Secondary | ICD-10-CM

## 2014-03-17 DIAGNOSIS — D509 Iron deficiency anemia, unspecified: Secondary | ICD-10-CM

## 2014-03-17 DIAGNOSIS — C189 Malignant neoplasm of colon, unspecified: Secondary | ICD-10-CM

## 2014-03-17 DIAGNOSIS — Z95828 Presence of other vascular implants and grafts: Secondary | ICD-10-CM

## 2014-03-17 LAB — CBC WITH DIFFERENTIAL/PLATELET
BASO%: 1.4 % (ref 0.0–2.0)
Basophils Absolute: 0.1 10*3/uL (ref 0.0–0.1)
EOS ABS: 0.3 10*3/uL (ref 0.0–0.5)
EOS%: 4.8 % (ref 0.0–7.0)
HEMATOCRIT: 38.9 % (ref 38.4–49.9)
HGB: 12.3 g/dL — ABNORMAL LOW (ref 13.0–17.1)
LYMPH%: 25.7 % (ref 14.0–49.0)
MCH: 25.6 pg — ABNORMAL LOW (ref 27.2–33.4)
MCHC: 31.6 g/dL — ABNORMAL LOW (ref 32.0–36.0)
MCV: 80.8 fL (ref 79.3–98.0)
MONO#: 1 10*3/uL — AB (ref 0.1–0.9)
MONO%: 15.1 % — ABNORMAL HIGH (ref 0.0–14.0)
NEUT%: 53 % (ref 39.0–75.0)
NEUTROS ABS: 3.5 10*3/uL (ref 1.5–6.5)
Platelets: 152 10*3/uL (ref 140–400)
RBC: 4.82 10*6/uL (ref 4.20–5.82)
RDW: 18.9 % — ABNORMAL HIGH (ref 11.0–14.6)
WBC: 6.5 10*3/uL (ref 4.0–10.3)
lymph#: 1.7 10*3/uL (ref 0.9–3.3)

## 2014-03-17 LAB — COMPREHENSIVE METABOLIC PANEL (CC13)
ALT: 11 U/L (ref 0–55)
ANION GAP: 6 meq/L (ref 3–11)
AST: 21 U/L (ref 5–34)
Albumin: 3.5 g/dL (ref 3.5–5.0)
Alkaline Phosphatase: 120 U/L (ref 40–150)
BILIRUBIN TOTAL: 0.27 mg/dL (ref 0.20–1.20)
BUN: 8.7 mg/dL (ref 7.0–26.0)
CALCIUM: 9.1 mg/dL (ref 8.4–10.4)
CHLORIDE: 113 meq/L — AB (ref 98–109)
CO2: 24 meq/L (ref 22–29)
Creatinine: 0.9 mg/dL (ref 0.7–1.3)
Glucose: 107 mg/dl (ref 70–140)
Potassium: 3.8 mEq/L (ref 3.5–5.1)
SODIUM: 143 meq/L (ref 136–145)
TOTAL PROTEIN: 6.8 g/dL (ref 6.4–8.3)

## 2014-03-17 MED ORDER — LEUCOVORIN CALCIUM INJECTION 350 MG
400.0000 mg/m2 | Freq: Once | INTRAVENOUS | Status: AC
  Administered 2014-03-17: 800 mg via INTRAVENOUS
  Filled 2014-03-17: qty 40

## 2014-03-17 MED ORDER — ONDANSETRON 8 MG/NS 50 ML IVPB
INTRAVENOUS | Status: AC
  Filled 2014-03-17: qty 8

## 2014-03-17 MED ORDER — SODIUM CHLORIDE 0.9 % IV SOLN
2400.0000 mg/m2 | INTRAVENOUS | Status: DC
  Administered 2014-03-17: 4750 mg via INTRAVENOUS
  Filled 2014-03-17: qty 95

## 2014-03-17 MED ORDER — OXALIPLATIN CHEMO INJECTION 100 MG/20ML
85.0000 mg/m2 | Freq: Once | INTRAVENOUS | Status: AC
  Administered 2014-03-17: 165 mg via INTRAVENOUS
  Filled 2014-03-17: qty 33

## 2014-03-17 MED ORDER — DEXTROSE 5 % IV SOLN
Freq: Once | INTRAVENOUS | Status: AC
  Administered 2014-03-17: 13:00:00 via INTRAVENOUS

## 2014-03-17 MED ORDER — ONDANSETRON 8 MG/50ML IVPB (CHCC)
8.0000 mg | Freq: Once | INTRAVENOUS | Status: AC
  Administered 2014-03-17: 8 mg via INTRAVENOUS

## 2014-03-17 MED ORDER — SODIUM CHLORIDE 0.9 % IJ SOLN
10.0000 mL | INTRAMUSCULAR | Status: DC | PRN
  Administered 2014-03-17: 10 mL via INTRAVENOUS
  Filled 2014-03-17: qty 10

## 2014-03-17 MED ORDER — FLUOROURACIL CHEMO INJECTION 2.5 GM/50ML
400.0000 mg/m2 | Freq: Once | INTRAVENOUS | Status: AC
  Administered 2014-03-17: 800 mg via INTRAVENOUS
  Filled 2014-03-17: qty 16

## 2014-03-17 MED ORDER — DEXAMETHASONE SODIUM PHOSPHATE 10 MG/ML IJ SOLN
10.0000 mg | Freq: Once | INTRAMUSCULAR | Status: AC
  Administered 2014-03-17: 10 mg via INTRAVENOUS

## 2014-03-17 MED ORDER — DEXAMETHASONE SODIUM PHOSPHATE 10 MG/ML IJ SOLN
INTRAMUSCULAR | Status: AC
  Filled 2014-03-17: qty 1

## 2014-03-17 NOTE — Telephone Encounter (Signed)
Per staff message and POF I have scheduled appts. Advised scheduler of appts. JMW  

## 2014-03-17 NOTE — Patient Instructions (Signed)

## 2014-03-17 NOTE — Telephone Encounter (Signed)
m °

## 2014-03-17 NOTE — Progress Notes (Signed)
  [facility] OFFICE PROGRESS NOTE   Diagnosis: Colon cancer  INTERVAL HISTORY:   He returns as scheduled. He completed cycle 8 FOLFOX and 03/03/2014. No nausea/vomiting, mouth sores, or neuropathy symptoms. He feels well. No complaint.  Objective:  Vital signs in last 24 hours:  Blood pressure 111/81, pulse 70, temperature 98 F (36.7 C), temperature source Oral, resp. rate 18, height 6' (1.829 m), weight 193 lb 12.8 oz (87.907 kg), SpO2 99.00%.    HEENT: No thrush or ulcer, mild whitecoat over the tongue Resp: Lungs clear bilaterally Cardio: Regular rate and rhythm GI: No hepatomegaly, nontender Vascular: No leg edema Neuro: The vibratory sense is intact at the fingertips bilaterally  Skin: Mild hyperpigmentation and skin thickening over the hands   Portacath/PICC-without erythema  Lab Results:  Lab Results  Component Value Date   WBC 6.5 03/17/2014   HGB 12.3* 03/17/2014   HCT 38.9 03/17/2014   MCV 80.8 03/17/2014   PLT 152 03/17/2014   NEUTROABS 3.5 03/17/2014    Lab Results  Component Value Date   CEA <0.5 10/03/2013    Medications: I have reviewed the patient's current medications.  Assessment/Plan: 1. Clinical stage IV (T4, N2b,M1a) adenocarcinoma of the cecum, status post a right colectomy 10/04/2013. No loss of mismatch repair protein expression, microsatellite stable  Omental nodule resected at the time of surgery 10/04/2013 with the pathology confirming a lymph node metastasis.  Chest CT 10/30/2013 with no definite signs of metastatic disease. 4 mm pulmonary nodule lateral segment of the right middle lobe.  Initiation of FOLFOX chemotherapy 11/18/2013. 2. Microcytic anemia-likely iron deficiency anemia. Improved. He continues oral iron. 3. History of tobacco use. 4. 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 Oneida Alar. 5. Mild neutropenia 01/15/2014. Neulasta added beginning with cycle  5.  Disposition:  Rick Davenport appears well. He is completed 8 cycles of FOLFOX. He continues to tolerate the chemotherapy without significant acute toxicity. The plan is to proceed with cycle 9 FOLFOX today. He will return for an office visit and chemotherapy in 2 weeks.  Betsy Coder, MD  03/17/2014  11:49 AM

## 2014-03-17 NOTE — Patient Instructions (Signed)
[facility] Discharge Instructions for Patients Receiving Chemotherapy  Today you received the following chemotherapy agents :  Oxaliplatin,  Leucovorin,  Fluorouracil.  To help prevent nausea and vomiting after your treatment, we encourage you to take your nausea medication as prescribed by your physician.   If you develop nausea and vomiting that is not controlled by your nausea medication, call the clinic.   BELOW ARE SYMPTOMS THAT SHOULD BE REPORTED IMMEDIATELY:  *FEVER GREATER THAN 100.5 F  *CHILLS WITH OR WITHOUT FEVER  NAUSEA AND VOMITING THAT IS NOT CONTROLLED WITH YOUR NAUSEA MEDICATION  *UNUSUAL SHORTNESS OF BREATH  *UNUSUAL BRUISING OR BLEEDING  TENDERNESS IN MOUTH AND THROAT WITH OR WITHOUT PRESENCE OF ULCERS  *URINARY PROBLEMS  *BOWEL PROBLEMS  UNUSUAL RASH Items with * indicate a potential emergency and should be followed up as soon as possible.  Feel free to call the clinic you have any questions or concerns. The clinic phone number is [phone].    

## 2014-03-19 ENCOUNTER — Ambulatory Visit: Payer: Medicaid Other

## 2014-03-19 ENCOUNTER — Ambulatory Visit (HOSPITAL_BASED_OUTPATIENT_CLINIC_OR_DEPARTMENT_OTHER): Payer: Medicaid Other

## 2014-03-19 DIAGNOSIS — C180 Malignant neoplasm of cecum: Secondary | ICD-10-CM

## 2014-03-19 DIAGNOSIS — C772 Secondary and unspecified malignant neoplasm of intra-abdominal lymph nodes: Secondary | ICD-10-CM

## 2014-03-19 DIAGNOSIS — Z5189 Encounter for other specified aftercare: Secondary | ICD-10-CM

## 2014-03-19 MED ORDER — HEPARIN SOD (PORK) LOCK FLUSH 100 UNIT/ML IV SOLN
500.0000 [IU] | Freq: Once | INTRAVENOUS | Status: AC | PRN
  Administered 2014-03-19: 500 [IU]
  Filled 2014-03-19: qty 5

## 2014-03-19 MED ORDER — PEGFILGRASTIM INJECTION 6 MG/0.6ML
6.0000 mg | Freq: Once | SUBCUTANEOUS | Status: AC
  Administered 2014-03-19: 6 mg via SUBCUTANEOUS
  Filled 2014-03-19: qty 0.6

## 2014-03-19 MED ORDER — SODIUM CHLORIDE 0.9 % IJ SOLN
10.0000 mL | INTRAMUSCULAR | Status: DC | PRN
  Administered 2014-03-19: 10 mL
  Filled 2014-03-19: qty 10

## 2014-03-19 NOTE — Progress Notes (Signed)
neulasta given by flush nurse 

## 2014-03-31 ENCOUNTER — Ambulatory Visit (HOSPITAL_BASED_OUTPATIENT_CLINIC_OR_DEPARTMENT_OTHER): Payer: Medicaid Other

## 2014-03-31 ENCOUNTER — Other Ambulatory Visit (HOSPITAL_BASED_OUTPATIENT_CLINIC_OR_DEPARTMENT_OTHER): Payer: Medicaid Other

## 2014-03-31 ENCOUNTER — Telehealth: Payer: Self-pay | Admitting: Oncology

## 2014-03-31 ENCOUNTER — Ambulatory Visit (HOSPITAL_BASED_OUTPATIENT_CLINIC_OR_DEPARTMENT_OTHER): Payer: Medicaid Other | Admitting: Oncology

## 2014-03-31 VITALS — BP 120/85 | HR 71 | Temp 97.9°F | Resp 18 | Ht 72.0 in | Wt 188.3 lb

## 2014-03-31 DIAGNOSIS — C189 Malignant neoplasm of colon, unspecified: Secondary | ICD-10-CM

## 2014-03-31 DIAGNOSIS — D509 Iron deficiency anemia, unspecified: Secondary | ICD-10-CM

## 2014-03-31 DIAGNOSIS — C180 Malignant neoplasm of cecum: Secondary | ICD-10-CM

## 2014-03-31 DIAGNOSIS — Z95828 Presence of other vascular implants and grafts: Secondary | ICD-10-CM

## 2014-03-31 DIAGNOSIS — Z5111 Encounter for antineoplastic chemotherapy: Secondary | ICD-10-CM

## 2014-03-31 DIAGNOSIS — C772 Secondary and unspecified malignant neoplasm of intra-abdominal lymph nodes: Secondary | ICD-10-CM

## 2014-03-31 LAB — CBC WITH DIFFERENTIAL/PLATELET
BASO%: 1.1 % (ref 0.0–2.0)
Basophils Absolute: 0.1 10*3/uL (ref 0.0–0.1)
EOS%: 3.3 % (ref 0.0–7.0)
Eosinophils Absolute: 0.2 10*3/uL (ref 0.0–0.5)
HCT: 41.2 % (ref 38.4–49.9)
HEMOGLOBIN: 13.1 g/dL (ref 13.0–17.1)
LYMPH#: 1.7 10*3/uL (ref 0.9–3.3)
LYMPH%: 30.6 % (ref 14.0–49.0)
MCH: 25.6 pg — AB (ref 27.2–33.4)
MCHC: 31.7 g/dL — AB (ref 32.0–36.0)
MCV: 80.7 fL (ref 79.3–98.0)
MONO#: 1 10*3/uL — ABNORMAL HIGH (ref 0.1–0.9)
MONO%: 19 % — ABNORMAL HIGH (ref 0.0–14.0)
NEUT#: 2.5 10*3/uL (ref 1.5–6.5)
NEUT%: 46 % (ref 39.0–75.0)
Platelets: 153 10*3/uL (ref 140–400)
RBC: 5.1 10*6/uL (ref 4.20–5.82)
RDW: 18.4 % — AB (ref 11.0–14.6)
WBC: 5.5 10*3/uL (ref 4.0–10.3)

## 2014-03-31 LAB — COMPREHENSIVE METABOLIC PANEL (CC13)
ALT: 17 U/L (ref 0–55)
AST: 24 U/L (ref 5–34)
Albumin: 3.8 g/dL (ref 3.5–5.0)
Alkaline Phosphatase: 133 U/L (ref 40–150)
Anion Gap: 9 mEq/L (ref 3–11)
BUN: 11.8 mg/dL (ref 7.0–26.0)
CALCIUM: 9.5 mg/dL (ref 8.4–10.4)
CHLORIDE: 109 meq/L (ref 98–109)
CO2: 22 mEq/L (ref 22–29)
Creatinine: 0.9 mg/dL (ref 0.7–1.3)
Glucose: 86 mg/dl (ref 70–140)
POTASSIUM: 3.8 meq/L (ref 3.5–5.1)
SODIUM: 140 meq/L (ref 136–145)
Total Bilirubin: 0.63 mg/dL (ref 0.20–1.20)
Total Protein: 7.2 g/dL (ref 6.4–8.3)

## 2014-03-31 MED ORDER — ONDANSETRON 8 MG/50ML IVPB (CHCC)
8.0000 mg | Freq: Once | INTRAVENOUS | Status: AC
Start: 2014-03-31 — End: 2014-03-31
  Administered 2014-03-31: 8 mg via INTRAVENOUS

## 2014-03-31 MED ORDER — OXALIPLATIN CHEMO INJECTION 100 MG/20ML
85.0000 mg/m2 | Freq: Once | INTRAVENOUS | Status: AC
  Administered 2014-03-31: 165 mg via INTRAVENOUS
  Filled 2014-03-31: qty 33

## 2014-03-31 MED ORDER — DEXAMETHASONE SODIUM PHOSPHATE 10 MG/ML IJ SOLN
10.0000 mg | Freq: Once | INTRAMUSCULAR | Status: AC
  Administered 2014-03-31: 10 mg via INTRAVENOUS

## 2014-03-31 MED ORDER — DEXTROSE 5 % IV SOLN
Freq: Once | INTRAVENOUS | Status: AC
  Administered 2014-03-31: 10:00:00 via INTRAVENOUS

## 2014-03-31 MED ORDER — SODIUM CHLORIDE 0.9 % IJ SOLN
10.0000 mL | INTRAMUSCULAR | Status: DC | PRN
  Administered 2014-03-31: 10 mL via INTRAVENOUS
  Filled 2014-03-31: qty 10

## 2014-03-31 MED ORDER — FLUOROURACIL CHEMO INJECTION 2.5 GM/50ML
400.0000 mg/m2 | Freq: Once | INTRAVENOUS | Status: AC
  Administered 2014-03-31: 800 mg via INTRAVENOUS
  Filled 2014-03-31: qty 16

## 2014-03-31 MED ORDER — LEUCOVORIN CALCIUM INJECTION 350 MG
400.0000 mg/m2 | Freq: Once | INTRAVENOUS | Status: AC
  Administered 2014-03-31: 800 mg via INTRAVENOUS
  Filled 2014-03-31: qty 40

## 2014-03-31 MED ORDER — FLUOROURACIL CHEMO INJECTION 5 GM/100ML
2400.0000 mg/m2 | INTRAVENOUS | Status: DC
  Administered 2014-03-31: 4750 mg via INTRAVENOUS
  Filled 2014-03-31: qty 95

## 2014-03-31 MED ORDER — SODIUM CHLORIDE 0.9 % IJ SOLN
10.0000 mL | INTRAMUSCULAR | Status: DC | PRN
  Filled 2014-03-31: qty 10

## 2014-03-31 NOTE — Patient Instructions (Signed)
[facility] Discharge Instructions for Patients Receiving Chemotherapy  Today you received the following chemotherapy agents FOLFOX.   To help prevent nausea and vomiting after your treatment, we encourage you to take your nausea medication as directed.    If you develop nausea and vomiting that is not controlled by your nausea medication, call the clinic.   BELOW ARE SYMPTOMS THAT SHOULD BE REPORTED IMMEDIATELY:  *FEVER GREATER THAN 100.5 F  *CHILLS WITH OR WITHOUT FEVER  NAUSEA AND VOMITING THAT IS NOT CONTROLLED WITH YOUR NAUSEA MEDICATION  *UNUSUAL SHORTNESS OF BREATH  *UNUSUAL BRUISING OR BLEEDING  TENDERNESS IN MOUTH AND THROAT WITH OR WITHOUT PRESENCE OF ULCERS  *URINARY PROBLEMS  *BOWEL PROBLEMS  UNUSUAL RASH Items with * indicate a potential emergency and should be followed up as soon as possible.  Feel free to call the clinic you have any questions or concerns. The clinic phone number is [phone].    

## 2014-03-31 NOTE — Telephone Encounter (Signed)
gv pt appt schedule for oct/nov °

## 2014-03-31 NOTE — Progress Notes (Signed)
  [place] OFFICE PROGRESS NOTE   Diagnosis:  Colon cancer  INTERVAL HISTORY:   He returns as scheduled. He completed cycle 9 FOLFOX 03/17/2014. No nausea, diarrhea, or neuropathy symptoms. He feels well. Good appetite. No complaint  Objective:  Vital signs in last 24 hours:  Blood pressure 120/85, pulse 71, temperature 97.9 F (36.6 C), temperature source Oral, resp. rate 18, height 6' (1.829 m), weight 188 lb 4.8 oz (85.412 kg), SpO2 100.00%.    HEENT: No thrush or ulcer Resp: Lungs clear bilaterally Cardio: Regular rate and rhythm GI: No hepatomegaly, nontender Vascular: No leg edema Neuro: Very mild decrease in vibratory sense at the fingertips bilaterally  Skin: Hyperpigmentation of the hands   Portacath/PICC-without erythema  Lab Results:  Lab Results  Component Value Date   WBC 5.5 03/31/2014   HGB 13.1 03/31/2014   HCT 41.2 03/31/2014   MCV 80.7 03/31/2014   PLT 153 03/31/2014   NEUTROABS 2.5 03/31/2014    Medications: I have reviewed the patient's current medications.  Assessment/Plan: 1. Clinical stage IV (T4, N2b,M1a) adenocarcinoma of the cecum, status post a right colectomy 10/04/2013. No loss of mismatch repair protein expression, microsatellite stable  Omental nodule resected at the time of surgery 10/04/2013 with the pathology confirming a lymph node metastasis.  Chest CT 10/30/2013 with no definite signs of metastatic disease. 4 mm pulmonary nodule lateral segment of the right middle lobe.  Initiation of FOLFOX chemotherapy 11/18/2013. 2. Microcytic anemia-likely iron deficiency anemia. Improved. He continues oral iron. 3. History of tobacco use. 4. 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 Oneida Alar. 5. Mild neutropenia 01/15/2014. Neulasta added beginning with cycle 5.   Disposition:   Mr. Rosier continues to tolerate the chemotherapy well. The plan is to proceed with cycle 10 FOLFOX today.  He will return for an office visit and chemotherapy in 2 weeks  Betsy Coder, MD  03/31/2014  10:04 AM

## 2014-03-31 NOTE — Patient Instructions (Signed)

## 2014-04-02 ENCOUNTER — Ambulatory Visit: Payer: Medicaid Other

## 2014-04-02 ENCOUNTER — Ambulatory Visit (HOSPITAL_BASED_OUTPATIENT_CLINIC_OR_DEPARTMENT_OTHER): Payer: Medicaid Other

## 2014-04-02 DIAGNOSIS — C180 Malignant neoplasm of cecum: Secondary | ICD-10-CM

## 2014-04-02 DIAGNOSIS — Z5189 Encounter for other specified aftercare: Secondary | ICD-10-CM

## 2014-04-02 DIAGNOSIS — C772 Secondary and unspecified malignant neoplasm of intra-abdominal lymph nodes: Secondary | ICD-10-CM

## 2014-04-02 MED ORDER — HEPARIN SOD (PORK) LOCK FLUSH 100 UNIT/ML IV SOLN
500.0000 [IU] | Freq: Once | INTRAVENOUS | Status: AC | PRN
  Administered 2014-04-02: 500 [IU]
  Filled 2014-04-02: qty 5

## 2014-04-02 MED ORDER — PEGFILGRASTIM INJECTION 6 MG/0.6ML
6.0000 mg | Freq: Once | SUBCUTANEOUS | Status: AC
  Administered 2014-04-02: 6 mg via SUBCUTANEOUS
  Filled 2014-04-02: qty 0.6

## 2014-04-02 MED ORDER — SODIUM CHLORIDE 0.9 % IJ SOLN
10.0000 mL | INTRAMUSCULAR | Status: DC | PRN
  Administered 2014-04-02: 10 mL
  Filled 2014-04-02: qty 10

## 2014-04-02 NOTE — Patient Instructions (Signed)
Pegfilgrastim injection What is this medicine? PEGFILGRASTIM (peg fil GRA stim) is a long-acting granulocyte colony-stimulating factor that stimulates the growth of neutrophils, a type of white blood cell important in the body's fight against infection. It is used to reduce the incidence of fever and infection in patients with certain types of cancer who are receiving chemotherapy that affects the bone marrow. This medicine may be used for other purposes; ask your health care provider or pharmacist if you have questions. COMMON BRAND NAME(S): Neulasta What should I tell my health care provider before I take this medicine? They need to know if you have any of these conditions: -latex allergy -ongoing radiation therapy -sickle cell disease -skin reactions to acrylic adhesives (On-Body Injector only) -an unusual or allergic reaction to pegfilgrastim, filgrastim, other medicines, foods, dyes, or preservatives -pregnant or trying to get pregnant -breast-feeding How should I use this medicine? This medicine is for injection under the skin. If you get this medicine at home, you will be taught how to prepare and give the pre-filled syringe or how to use the On-body Injector. Refer to the patient Instructions for Use for detailed instructions. Use exactly as directed. Take your medicine at regular intervals. Do not take your medicine more often than directed. It is important that you put your used needles and syringes in a special sharps container. Do not put them in a trash can. If you do not have a sharps container, call your pharmacist or healthcare provider to get one. Talk to your pediatrician regarding the use of this medicine in children. Special care may be needed. Overdosage: If you think you have taken too much of this medicine contact a poison control center or emergency room at once. NOTE: This medicine is only for you. Do not share this medicine with others. What if I miss a dose? It is  important not to miss your dose. Call your doctor or health care professional if you miss your dose. If you miss a dose due to an On-body Injector failure or leakage, a new dose should be administered as soon as possible using a single prefilled syringe for manual use. What may interact with this medicine? Interactions have not been studied. Give your health care provider a list of all the medicines, herbs, non-prescription drugs, or dietary supplements you use. Also tell them if you smoke, drink alcohol, or use illegal drugs. Some items may interact with your medicine. This list may not describe all possible interactions. Give your health care provider a list of all the medicines, herbs, non-prescription drugs, or dietary supplements you use. Also tell them if you smoke, drink alcohol, or use illegal drugs. Some items may interact with your medicine. What should I watch for while using this medicine? You may need blood work done while you are taking this medicine. If you are going to need a MRI, CT scan, or other procedure, tell your doctor that you are using this medicine (On-Body Injector only). What side effects may I notice from receiving this medicine? Side effects that you should report to your doctor or health care professional as soon as possible: -allergic reactions like skin rash, itching or hives, swelling of the face, lips, or tongue -dizziness -fever -pain, redness, or irritation at site where injected -pinpoint red spots on the skin -shortness of breath or breathing problems -stomach or side pain, or pain at the shoulder -swelling -tiredness -trouble passing urine Side effects that usually do not require medical attention (report to your doctor   or health care professional if they continue or are bothersome): -bone pain -muscle pain This list may not describe all possible side effects. Call your doctor for medical advice about side effects. You may report side effects to FDA at  1-800-FDA-1088. Where should I keep my medicine? Keep out of the reach of children. Store pre-filled syringes in a refrigerator between 2 and 8 degrees C (36 and 46 degrees F). Do not freeze. Keep in carton to protect from light. Throw away this medicine if it is left out of the refrigerator for more than 48 hours. Throw away any unused medicine after the expiration date. NOTE: This sheet is a summary. It may not cover all possible information. If you have questions about this medicine, talk to your doctor, pharmacist, or health care provider.  2015, Elsevier/Gold Standard. (2013-09-05 16:14:05)  

## 2014-04-13 ENCOUNTER — Other Ambulatory Visit: Payer: Self-pay | Admitting: Oncology

## 2014-04-14 ENCOUNTER — Ambulatory Visit (HOSPITAL_BASED_OUTPATIENT_CLINIC_OR_DEPARTMENT_OTHER): Payer: Medicaid Other | Admitting: Nurse Practitioner

## 2014-04-14 ENCOUNTER — Other Ambulatory Visit (HOSPITAL_BASED_OUTPATIENT_CLINIC_OR_DEPARTMENT_OTHER): Payer: Medicaid Other

## 2014-04-14 ENCOUNTER — Ambulatory Visit (HOSPITAL_BASED_OUTPATIENT_CLINIC_OR_DEPARTMENT_OTHER): Payer: Medicaid Other

## 2014-04-14 ENCOUNTER — Telehealth: Payer: Self-pay | Admitting: Nurse Practitioner

## 2014-04-14 VITALS — BP 122/88 | HR 69 | Temp 98.1°F | Resp 20 | Ht 72.0 in | Wt 189.0 lb

## 2014-04-14 DIAGNOSIS — C786 Secondary malignant neoplasm of retroperitoneum and peritoneum: Secondary | ICD-10-CM

## 2014-04-14 DIAGNOSIS — C180 Malignant neoplasm of cecum: Secondary | ICD-10-CM

## 2014-04-14 DIAGNOSIS — Z5111 Encounter for antineoplastic chemotherapy: Secondary | ICD-10-CM

## 2014-04-14 DIAGNOSIS — D509 Iron deficiency anemia, unspecified: Secondary | ICD-10-CM

## 2014-04-14 DIAGNOSIS — C189 Malignant neoplasm of colon, unspecified: Secondary | ICD-10-CM

## 2014-04-14 DIAGNOSIS — Z95828 Presence of other vascular implants and grafts: Secondary | ICD-10-CM

## 2014-04-14 LAB — CBC WITH DIFFERENTIAL/PLATELET
BASO%: 0.9 % (ref 0.0–2.0)
Basophils Absolute: 0.1 10*3/uL (ref 0.0–0.1)
EOS%: 2.1 % (ref 0.0–7.0)
Eosinophils Absolute: 0.2 10*3/uL (ref 0.0–0.5)
HCT: 39.3 % (ref 38.4–49.9)
HGB: 12.4 g/dL — ABNORMAL LOW (ref 13.0–17.1)
LYMPH%: 18.2 % (ref 14.0–49.0)
MCH: 25.9 pg — ABNORMAL LOW (ref 27.2–33.4)
MCHC: 31.5 g/dL — AB (ref 32.0–36.0)
MCV: 82.1 fL (ref 79.3–98.0)
MONO#: 0.9 10*3/uL (ref 0.1–0.9)
MONO%: 12.1 % (ref 0.0–14.0)
NEUT%: 66.7 % (ref 39.0–75.0)
NEUTROS ABS: 4.8 10*3/uL (ref 1.5–6.5)
PLATELETS: 140 10*3/uL (ref 140–400)
RBC: 4.79 10*6/uL (ref 4.20–5.82)
RDW: 20.1 % — ABNORMAL HIGH (ref 11.0–14.6)
WBC: 7.1 10*3/uL (ref 4.0–10.3)
lymph#: 1.3 10*3/uL (ref 0.9–3.3)

## 2014-04-14 LAB — COMPREHENSIVE METABOLIC PANEL (CC13)
ALBUMIN: 3.8 g/dL (ref 3.5–5.0)
ALK PHOS: 129 U/L (ref 40–150)
ALT: 16 U/L (ref 0–55)
AST: 23 U/L (ref 5–34)
Anion Gap: 8 mEq/L (ref 3–11)
BILIRUBIN TOTAL: 0.59 mg/dL (ref 0.20–1.20)
BUN: 9.6 mg/dL (ref 7.0–26.0)
CO2: 21 mEq/L — ABNORMAL LOW (ref 22–29)
Calcium: 9.2 mg/dL (ref 8.4–10.4)
Chloride: 110 mEq/L — ABNORMAL HIGH (ref 98–109)
Creatinine: 0.8 mg/dL (ref 0.7–1.3)
Glucose: 83 mg/dl (ref 70–140)
POTASSIUM: 3.7 meq/L (ref 3.5–5.1)
Sodium: 139 mEq/L (ref 136–145)
Total Protein: 6.7 g/dL (ref 6.4–8.3)

## 2014-04-14 MED ORDER — SODIUM CHLORIDE 0.9 % IJ SOLN
10.0000 mL | INTRAMUSCULAR | Status: DC | PRN
  Administered 2014-04-14: 10 mL via INTRAVENOUS
  Filled 2014-04-14: qty 10

## 2014-04-14 MED ORDER — DEXAMETHASONE SODIUM PHOSPHATE 10 MG/ML IJ SOLN
INTRAMUSCULAR | Status: AC
  Filled 2014-04-14: qty 1

## 2014-04-14 MED ORDER — DEXAMETHASONE SODIUM PHOSPHATE 10 MG/ML IJ SOLN
10.0000 mg | Freq: Once | INTRAMUSCULAR | Status: AC
  Administered 2014-04-14: 10 mg via INTRAVENOUS

## 2014-04-14 MED ORDER — ONDANSETRON 8 MG/50ML IVPB (CHCC)
8.0000 mg | Freq: Once | INTRAVENOUS | Status: AC
  Administered 2014-04-14: 8 mg via INTRAVENOUS

## 2014-04-14 MED ORDER — DEXTROSE 5 % IV SOLN
Freq: Once | INTRAVENOUS | Status: AC
  Administered 2014-04-14: 10:00:00 via INTRAVENOUS

## 2014-04-14 MED ORDER — SODIUM CHLORIDE 0.9 % IV SOLN
2400.0000 mg/m2 | INTRAVENOUS | Status: DC
  Administered 2014-04-14: 4750 mg via INTRAVENOUS
  Filled 2014-04-14: qty 95

## 2014-04-14 MED ORDER — LEUCOVORIN CALCIUM INJECTION 350 MG
400.0000 mg/m2 | Freq: Once | INTRAVENOUS | Status: AC
  Administered 2014-04-14: 800 mg via INTRAVENOUS
  Filled 2014-04-14: qty 40

## 2014-04-14 MED ORDER — ONDANSETRON 8 MG/NS 50 ML IVPB
INTRAVENOUS | Status: AC
  Filled 2014-04-14: qty 8

## 2014-04-14 MED ORDER — FLUOROURACIL CHEMO INJECTION 2.5 GM/50ML
400.0000 mg/m2 | Freq: Once | INTRAVENOUS | Status: AC
Start: 2014-04-14 — End: 2014-04-14
  Administered 2014-04-14: 800 mg via INTRAVENOUS
  Filled 2014-04-14: qty 16

## 2014-04-14 MED ORDER — OXALIPLATIN CHEMO INJECTION 100 MG/20ML
85.0000 mg/m2 | Freq: Once | INTRAVENOUS | Status: AC
  Administered 2014-04-14: 165 mg via INTRAVENOUS
  Filled 2014-04-14: qty 33

## 2014-04-14 NOTE — Progress Notes (Signed)
  [place] OFFICE PROGRESS NOTE   Diagnosis:  Colon cancer  INTERVAL HISTORY:   [name] returns as scheduled. He completed cycle 10 FOLFOX on 03/31/2014. He feels well. He denies pain. No nausea or vomiting. No mouth sores. No diarrhea. He has a good appetite. He avoids cold for 5-7 days after each treatment. No persistent neuropathy symptoms.  Objective:  Vital signs in last 24 hours:  Blood pressure 122/88, pulse 69, temperature 98.1 F (36.7 C), temperature source Oral, resp. rate 20, height 6' (1.829 m), weight 189 lb (85.73 kg).    HEENT: No thrush or ulcers. Resp: Lungs clear bilaterally. Cardio: Regular rate and rhythm. GI: Abdomen soft and nontender. No hepatomegaly. Vascular: No leg edema. Calves soft and nontender. Neuro: Minimal decrease in vibratory sense over the fingertips bilaterally per tuning fork exam.  Skin: Hyperpigmentation over the palms. No erythema. Port-A-Cath without erythema.    Lab Results:  Lab Results  Component Value Date   WBC 7.1 04/14/2014   HGB 12.4* 04/14/2014   HCT 39.3 04/14/2014   MCV 82.1 04/14/2014   PLT 140 04/14/2014   NEUTROABS 4.8 04/14/2014    Imaging:  No results found.  Medications: I have reviewed the patient's current medications.  Assessment/Plan: 1. Clinical stage IV (T4, N2b,M1a) adenocarcinoma of the cecum, status post a right colectomy 10/04/2013. No loss of mismatch repair protein expression, microsatellite stable  Omental nodule resected at the time of surgery 10/04/2013 with the pathology confirming a lymph node metastasis.  Chest CT 10/30/2013 with no definite signs of metastatic disease. 4 mm pulmonary nodule lateral segment of the right middle lobe.  Initiation of FOLFOX chemotherapy 11/18/2013. 2. Microcytic anemia-likely iron deficiency anemia. Improved. He continues oral iron. 3. History of tobacco use. 4. 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 Oneida Alar. 5. Mild neutropenia 01/15/2014. Neulasta added beginning with cycle 5.    Disposition: Rick Davenport appears well. He has completed 10 cycles of FOLFOX. Plan to proceed with cycle 11 today as scheduled. He will return for the 12th and final cycle in 2 weeks. He will contact the office in the interim with any problems.    [name] ANP/GNP-BC   04/14/2014  9:27 AM

## 2014-04-14 NOTE — Telephone Encounter (Signed)
Pt confirmed labs/ov per 10/26 POF, gave pt AVS.... KJ °

## 2014-04-14 NOTE — Patient Instructions (Signed)
[place] Discharge Instructions for Patients Receiving Chemotherapy  Today you received the following chemotherapy agents Oxaliplatin, Leucovorin, and 5FU  To help prevent nausea and vomiting after your treatment, we encourage you to take your nausea medication Compazine 10 mg every 6 hours as needed.  If you develop nausea and vomiting that is not controlled by your nausea medication, call the clinic.   BELOW ARE SYMPTOMS THAT SHOULD BE REPORTED IMMEDIATELY:  *FEVER GREATER THAN 100.5 F  *CHILLS WITH OR WITHOUT FEVER  NAUSEA AND VOMITING THAT IS NOT CONTROLLED WITH YOUR NAUSEA MEDICATION  *UNUSUAL SHORTNESS OF BREATH  *UNUSUAL BRUISING OR BLEEDING  TENDERNESS IN MOUTH AND THROAT WITH OR WITHOUT PRESENCE OF ULCERS  *URINARY PROBLEMS  *BOWEL PROBLEMS  UNUSUAL RASH Items with * indicate a potential emergency and should be followed up as soon as possible.  Feel free to call the clinic you have any questions or concerns. The clinic phone number is [phone].

## 2014-04-14 NOTE — Patient Instructions (Signed)

## 2014-04-16 ENCOUNTER — Ambulatory Visit (HOSPITAL_BASED_OUTPATIENT_CLINIC_OR_DEPARTMENT_OTHER): Payer: Medicaid Other

## 2014-04-16 ENCOUNTER — Ambulatory Visit: Payer: Medicaid Other

## 2014-04-16 ENCOUNTER — Other Ambulatory Visit: Payer: Self-pay

## 2014-04-16 VITALS — BP 105/78 | HR 72 | Temp 97.5°F

## 2014-04-16 DIAGNOSIS — C180 Malignant neoplasm of cecum: Secondary | ICD-10-CM

## 2014-04-16 DIAGNOSIS — Z5189 Encounter for other specified aftercare: Secondary | ICD-10-CM

## 2014-04-16 DIAGNOSIS — C772 Secondary and unspecified malignant neoplasm of intra-abdominal lymph nodes: Secondary | ICD-10-CM

## 2014-04-16 MED ORDER — SODIUM CHLORIDE 0.9 % IJ SOLN
10.0000 mL | INTRAMUSCULAR | Status: DC | PRN
  Administered 2014-04-16: 10 mL
  Filled 2014-04-16: qty 10

## 2014-04-16 MED ORDER — PEGFILGRASTIM INJECTION 6 MG/0.6ML
6.0000 mg | Freq: Once | SUBCUTANEOUS | Status: AC
  Administered 2014-04-16: 6 mg via SUBCUTANEOUS
  Filled 2014-04-16: qty 0.6

## 2014-04-16 MED ORDER — HEPARIN SOD (PORK) LOCK FLUSH 100 UNIT/ML IV SOLN
500.0000 [IU] | Freq: Once | INTRAVENOUS | Status: AC | PRN
  Administered 2014-04-16: 500 [IU]
  Filled 2014-04-16: qty 5

## 2014-04-16 NOTE — Patient Instructions (Signed)
Pegfilgrastim injection What is this medicine? PEGFILGRASTIM (peg fil GRA stim) is a long-acting granulocyte colony-stimulating factor that stimulates the growth of neutrophils, a type of white blood cell important in the body's fight against infection. It is used to reduce the incidence of fever and infection in patients with certain types of cancer who are receiving chemotherapy that affects the bone marrow. This medicine may be used for other purposes; ask your health care provider or pharmacist if you have questions. COMMON BRAND NAME(S): Neulasta What should I tell my health care provider before I take this medicine? They need to know if you have any of these conditions: -latex allergy -ongoing radiation therapy -sickle cell disease -skin reactions to acrylic adhesives (On-Body Injector only) -an unusual or allergic reaction to pegfilgrastim, filgrastim, other medicines, foods, dyes, or preservatives -pregnant or trying to get pregnant -breast-feeding How should I use this medicine? This medicine is for injection under the skin. If you get this medicine at home, you will be taught how to prepare and give the pre-filled syringe or how to use the On-body Injector. Refer to the patient Instructions for Use for detailed instructions. Use exactly as directed. Take your medicine at regular intervals. Do not take your medicine more often than directed. It is important that you put your used needles and syringes in a special sharps container. Do not put them in a trash can. If you do not have a sharps container, call your pharmacist or healthcare provider to get one. Talk to your pediatrician regarding the use of this medicine in children. Special care may be needed. Overdosage: If you think you have taken too much of this medicine contact a poison control center or emergency room at once. NOTE: This medicine is only for you. Do not share this medicine with others. What if I miss a dose? It is  important not to miss your dose. Call your doctor or health care professional if you miss your dose. If you miss a dose due to an On-body Injector failure or leakage, a new dose should be administered as soon as possible using a single prefilled syringe for manual use. What may interact with this medicine? Interactions have not been studied. Give your health care provider a list of all the medicines, herbs, non-prescription drugs, or dietary supplements you use. Also tell them if you smoke, drink alcohol, or use illegal drugs. Some items may interact with your medicine. This list may not describe all possible interactions. Give your health care provider a list of all the medicines, herbs, non-prescription drugs, or dietary supplements you use. Also tell them if you smoke, drink alcohol, or use illegal drugs. Some items may interact with your medicine. What should I watch for while using this medicine? You may need blood work done while you are taking this medicine. If you are going to need a MRI, CT scan, or other procedure, tell your doctor that you are using this medicine (On-Body Injector only). What side effects may I notice from receiving this medicine? Side effects that you should report to your doctor or health care professional as soon as possible: -allergic reactions like skin rash, itching or hives, swelling of the face, lips, or tongue -dizziness -fever -pain, redness, or irritation at site where injected -pinpoint red spots on the skin -shortness of breath or breathing problems -stomach or side pain, or pain at the shoulder -swelling -tiredness -trouble passing urine Side effects that usually do not require medical attention (report to your doctor   or health care professional if they continue or are bothersome): -bone pain -muscle pain This list may not describe all possible side effects. Call your doctor for medical advice about side effects. You may report side effects to FDA at  1-800-FDA-1088. Where should I keep my medicine? Keep out of the reach of children. Store pre-filled syringes in a refrigerator between 2 and 8 degrees C (36 and 46 degrees F). Do not freeze. Keep in carton to protect from light. Throw away this medicine if it is left out of the refrigerator for more than 48 hours. Throw away any unused medicine after the expiration date. NOTE: This sheet is a summary. It may not cover all possible information. If you have questions about this medicine, talk to your doctor, pharmacist, or health care provider.  2015, Elsevier/Gold Standard. (2013-09-05 16:14:05)  

## 2014-04-27 ENCOUNTER — Other Ambulatory Visit: Payer: Self-pay | Admitting: Oncology

## 2014-04-28 ENCOUNTER — Other Ambulatory Visit (HOSPITAL_BASED_OUTPATIENT_CLINIC_OR_DEPARTMENT_OTHER): Payer: Medicaid Other

## 2014-04-28 ENCOUNTER — Ambulatory Visit (HOSPITAL_BASED_OUTPATIENT_CLINIC_OR_DEPARTMENT_OTHER): Payer: Medicaid Other

## 2014-04-28 ENCOUNTER — Ambulatory Visit: Payer: Medicaid Other

## 2014-04-28 ENCOUNTER — Ambulatory Visit (HOSPITAL_BASED_OUTPATIENT_CLINIC_OR_DEPARTMENT_OTHER): Payer: Medicaid Other | Admitting: Nurse Practitioner

## 2014-04-28 VITALS — BP 117/87 | HR 56 | Temp 98.2°F | Resp 18 | Ht 72.0 in | Wt 189.4 lb

## 2014-04-28 DIAGNOSIS — C772 Secondary and unspecified malignant neoplasm of intra-abdominal lymph nodes: Secondary | ICD-10-CM

## 2014-04-28 DIAGNOSIS — C180 Malignant neoplasm of cecum: Secondary | ICD-10-CM

## 2014-04-28 DIAGNOSIS — Z5111 Encounter for antineoplastic chemotherapy: Secondary | ICD-10-CM

## 2014-04-28 DIAGNOSIS — C786 Secondary malignant neoplasm of retroperitoneum and peritoneum: Secondary | ICD-10-CM

## 2014-04-28 DIAGNOSIS — R911 Solitary pulmonary nodule: Secondary | ICD-10-CM

## 2014-04-28 DIAGNOSIS — C189 Malignant neoplasm of colon, unspecified: Secondary | ICD-10-CM

## 2014-04-28 DIAGNOSIS — D509 Iron deficiency anemia, unspecified: Secondary | ICD-10-CM

## 2014-04-28 DIAGNOSIS — Z95828 Presence of other vascular implants and grafts: Secondary | ICD-10-CM

## 2014-04-28 LAB — CBC WITH DIFFERENTIAL/PLATELET
BASO%: 0.5 % (ref 0.0–2.0)
Basophils Absolute: 0 10*3/uL (ref 0.0–0.1)
EOS%: 3 % (ref 0.0–7.0)
Eosinophils Absolute: 0.2 10*3/uL (ref 0.0–0.5)
HEMATOCRIT: 36.7 % — AB (ref 38.4–49.9)
HGB: 12.3 g/dL — ABNORMAL LOW (ref 13.0–17.1)
LYMPH%: 29.8 % (ref 14.0–49.0)
MCH: 26.7 pg — AB (ref 27.2–33.4)
MCHC: 33.5 g/dL (ref 32.0–36.0)
MCV: 79.6 fL (ref 79.3–98.0)
MONO#: 0.9 10*3/uL (ref 0.1–0.9)
MONO%: 13.7 % (ref 0.0–14.0)
NEUT#: 3.4 10*3/uL (ref 1.5–6.5)
NEUT%: 53 % (ref 39.0–75.0)
Platelets: 112 10*3/uL — ABNORMAL LOW (ref 140–400)
RBC: 4.61 10*6/uL (ref 4.20–5.82)
RDW: 19.4 % — AB (ref 11.0–14.6)
WBC: 6.4 10*3/uL (ref 4.0–10.3)
lymph#: 1.9 10*3/uL (ref 0.9–3.3)
nRBC: 0 % (ref 0–0)

## 2014-04-28 LAB — COMPREHENSIVE METABOLIC PANEL (CC13)
ALT: 16 U/L (ref 0–55)
AST: 24 U/L (ref 5–34)
Albumin: 3.8 g/dL (ref 3.5–5.0)
Alkaline Phosphatase: 125 U/L (ref 40–150)
Anion Gap: 7 mEq/L (ref 3–11)
BUN: 12.3 mg/dL (ref 7.0–26.0)
CALCIUM: 8.9 mg/dL (ref 8.4–10.4)
CHLORIDE: 110 meq/L — AB (ref 98–109)
CO2: 24 mEq/L (ref 22–29)
Creatinine: 0.9 mg/dL (ref 0.7–1.3)
Glucose: 101 mg/dl (ref 70–140)
Potassium: 4 mEq/L (ref 3.5–5.1)
Sodium: 141 mEq/L (ref 136–145)
TOTAL PROTEIN: 6.7 g/dL (ref 6.4–8.3)
Total Bilirubin: 0.52 mg/dL (ref 0.20–1.20)

## 2014-04-28 MED ORDER — SODIUM CHLORIDE 0.9 % IV SOLN
2400.0000 mg/m2 | INTRAVENOUS | Status: DC
  Administered 2014-04-28: 4750 mg via INTRAVENOUS
  Filled 2014-04-28: qty 95

## 2014-04-28 MED ORDER — DEXTROSE 5 % IV SOLN
Freq: Once | INTRAVENOUS | Status: AC
  Administered 2014-04-28: 11:00:00 via INTRAVENOUS

## 2014-04-28 MED ORDER — DEXAMETHASONE SODIUM PHOSPHATE 10 MG/ML IJ SOLN
INTRAMUSCULAR | Status: AC
  Filled 2014-04-28: qty 1

## 2014-04-28 MED ORDER — ONDANSETRON 8 MG/NS 50 ML IVPB
INTRAVENOUS | Status: AC
  Filled 2014-04-28: qty 8

## 2014-04-28 MED ORDER — DEXAMETHASONE SODIUM PHOSPHATE 10 MG/ML IJ SOLN
10.0000 mg | Freq: Once | INTRAMUSCULAR | Status: AC
  Administered 2014-04-28: 10 mg via INTRAVENOUS

## 2014-04-28 MED ORDER — ONDANSETRON 8 MG/50ML IVPB (CHCC)
8.0000 mg | Freq: Once | INTRAVENOUS | Status: AC
  Administered 2014-04-28: 8 mg via INTRAVENOUS

## 2014-04-28 MED ORDER — SODIUM CHLORIDE 0.9 % IJ SOLN
10.0000 mL | INTRAMUSCULAR | Status: DC | PRN
  Administered 2014-04-28: 10 mL via INTRAVENOUS
  Filled 2014-04-28: qty 10

## 2014-04-28 MED ORDER — DEXTROSE 5 % IV SOLN
400.0000 mg/m2 | Freq: Once | INTRAVENOUS | Status: AC
  Administered 2014-04-28: 800 mg via INTRAVENOUS
  Filled 2014-04-28: qty 40

## 2014-04-28 MED ORDER — OXALIPLATIN CHEMO INJECTION 100 MG/20ML
85.0000 mg/m2 | Freq: Once | INTRAVENOUS | Status: AC
  Administered 2014-04-28: 165 mg via INTRAVENOUS
  Filled 2014-04-28: qty 33

## 2014-04-28 MED ORDER — FLUOROURACIL CHEMO INJECTION 2.5 GM/50ML
400.0000 mg/m2 | Freq: Once | INTRAVENOUS | Status: AC
  Administered 2014-04-28: 800 mg via INTRAVENOUS
  Filled 2014-04-28: qty 16

## 2014-04-28 NOTE — Progress Notes (Signed)
  [place] OFFICE PROGRESS NOTE   Diagnosis:  Colon cancer  INTERVAL HISTORY:   Rick Davenport returns as scheduled. He completed cycle 11 FOLFOX on 04/14/2014. He denies nausea/vomiting. No mouth sores. No diarrhea. He avoids cold for about 5 days following each treatment. He denies persistent neuropathy symptoms. He denies pain. He has a good appetite. He denies any bleeding.  Objective:  Vital signs in last 24 hours:  Blood pressure 143/93, pulse 69, temperature 98.2 F (36.8 C), temperature source Oral, resp. rate 18, height 6' (1.829 m), weight 189 lb 6.4 oz (85.911 kg).    HEENT: no thrush or ulcers. Resp: lungs clear bilaterally. Cardio: regular rate and rhythm. GI: abdomen soft and nontender. No hepatomegaly. Vascular: no leg edema. Calves soft and nontender. Neuro:minimal decrease in vibratory sense over the fingertips bilaterally per tuning fork exam.  Port-A-Cath without erythema.    Lab Results:  Lab Results  Component Value Date   WBC 6.4 04/28/2014   HGB 12.3* 04/28/2014   HCT 36.7* 04/28/2014   MCV 79.6 04/28/2014   PLT 112* 04/28/2014   NEUTROABS 3.4 04/28/2014    Imaging:  No results found.  Medications: I have reviewed the patient's current medications.  Assessment/Plan: 1. Clinical stage IV (T4, N2b,M1a) adenocarcinoma of the cecum, status post a right colectomy 10/04/2013.  No loss of mismatch repair protein expression, microsatellite stable   Omental nodule resected at the time of surgery 10/04/2013 with the pathology confirming a lymph node metastasis.   Chest CT 10/30/2013 with no definite signs of metastatic disease. 4 mm pulmonary nodule lateral segment of the right middle lobe.   Initiation of FOLFOX chemotherapy 11/18/2013.  Cycle 12 FOLFOX 04/28/2014. 2. Microcytic anemia-likely iron deficiency anemia. Improved. He continues oral iron. 3. History of tobacco use. 4. Right external iliac atheroma noted on the CT  abdomen 10/03/2013 -evaluated by Dr. Oneida Alar. 5. Mild neutropenia 01/15/2014. Neulasta added beginning with cycle 5.   Disposition: Rick Davenport appears stable. He has completed 11 cycles of FOLFOX. Plan to proceed with the 12th and final cycle today as scheduled. He will return for a followup visit on 06/09/2014. He will contact the office in the interim with any problems. We specifically discussed any bleeding.  Plan reviewed with Dr. Benay Spice.    [name] ANP/GNP-BC   04/28/2014  11:01 AM

## 2014-04-28 NOTE — Patient Instructions (Signed)

## 2014-04-28 NOTE — Patient Instructions (Signed)
[facility] Discharge Instructions for Patients Receiving Chemotherapy  Today you received the following chemotherapy agents: Oxaliplatin, Leucovorin, Adrucil.  To help prevent nausea and vomiting after your treatment, we encourage you to take your nausea medication as prescribed.   If you develop nausea and vomiting that is not controlled by your nausea medication, call the clinic.   BELOW ARE SYMPTOMS THAT SHOULD BE REPORTED IMMEDIATELY:  *FEVER GREATER THAN 100.5 F  *CHILLS WITH OR WITHOUT FEVER  NAUSEA AND VOMITING THAT IS NOT CONTROLLED WITH YOUR NAUSEA MEDICATION  *UNUSUAL SHORTNESS OF BREATH  *UNUSUAL BRUISING OR BLEEDING  TENDERNESS IN MOUTH AND THROAT WITH OR WITHOUT PRESENCE OF ULCERS  *URINARY PROBLEMS  *BOWEL PROBLEMS  UNUSUAL RASH Items with * indicate a potential emergency and should be followed up as soon as possible.  Feel free to call the clinic you have any questions or concerns. The clinic phone number is [phone].    

## 2014-04-30 ENCOUNTER — Ambulatory Visit (HOSPITAL_BASED_OUTPATIENT_CLINIC_OR_DEPARTMENT_OTHER): Payer: Medicaid Other

## 2014-04-30 ENCOUNTER — Ambulatory Visit: Payer: Medicaid Other

## 2014-04-30 DIAGNOSIS — Z5189 Encounter for other specified aftercare: Secondary | ICD-10-CM

## 2014-04-30 DIAGNOSIS — C772 Secondary and unspecified malignant neoplasm of intra-abdominal lymph nodes: Secondary | ICD-10-CM

## 2014-04-30 DIAGNOSIS — C180 Malignant neoplasm of cecum: Secondary | ICD-10-CM

## 2014-04-30 MED ORDER — PEGFILGRASTIM INJECTION 6 MG/0.6ML
6.0000 mg | Freq: Once | SUBCUTANEOUS | Status: AC
  Administered 2014-04-30: 6 mg via SUBCUTANEOUS
  Filled 2014-04-30: qty 0.6

## 2014-04-30 MED ORDER — HEPARIN SOD (PORK) LOCK FLUSH 100 UNIT/ML IV SOLN
500.0000 [IU] | Freq: Once | INTRAVENOUS | Status: AC | PRN
  Administered 2014-04-30: 500 [IU]
  Filled 2014-04-30: qty 5

## 2014-04-30 MED ORDER — SODIUM CHLORIDE 0.9 % IJ SOLN
10.0000 mL | INTRAMUSCULAR | Status: DC | PRN
  Administered 2014-04-30: 10 mL
  Filled 2014-04-30: qty 10

## 2014-04-30 NOTE — Progress Notes (Signed)
Neulasta given in flush room with pump d/c

## 2014-04-30 NOTE — Patient Instructions (Signed)
Pegfilgrastim injection What is this medicine? PEGFILGRASTIM (peg fil GRA stim) is a long-acting granulocyte colony-stimulating factor that stimulates the growth of neutrophils, a type of white blood cell important in the body's fight against infection. It is used to reduce the incidence of fever and infection in patients with certain types of cancer who are receiving chemotherapy that affects the bone marrow. This medicine may be used for other purposes; ask your health care provider or pharmacist if you have questions. COMMON BRAND NAME(S): Neulasta What should I tell my health care provider before I take this medicine? They need to know if you have any of these conditions: -latex allergy -ongoing radiation therapy -sickle cell disease -skin reactions to acrylic adhesives (On-Body Injector only) -an unusual or allergic reaction to pegfilgrastim, filgrastim, other medicines, foods, dyes, or preservatives -pregnant or trying to get pregnant -breast-feeding How should I use this medicine? This medicine is for injection under the skin. If you get this medicine at home, you will be taught how to prepare and give the pre-filled syringe or how to use the On-body Injector. Refer to the patient Instructions for Use for detailed instructions. Use exactly as directed. Take your medicine at regular intervals. Do not take your medicine more often than directed. It is important that you put your used needles and syringes in a special sharps container. Do not put them in a trash can. If you do not have a sharps container, call your pharmacist or healthcare provider to get one. Talk to your pediatrician regarding the use of this medicine in children. Special care may be needed. Overdosage: If you think you have taken too much of this medicine contact a poison control center or emergency room at once. NOTE: This medicine is only for you. Do not share this medicine with others. What if I miss a dose? It is  important not to miss your dose. Call your doctor or health care professional if you miss your dose. If you miss a dose due to an On-body Injector failure or leakage, a new dose should be administered as soon as possible using a single prefilled syringe for manual use. What may interact with this medicine? Interactions have not been studied. Give your health care provider a list of all the medicines, herbs, non-prescription drugs, or dietary supplements you use. Also tell them if you smoke, drink alcohol, or use illegal drugs. Some items may interact with your medicine. This list may not describe all possible interactions. Give your health care provider a list of all the medicines, herbs, non-prescription drugs, or dietary supplements you use. Also tell them if you smoke, drink alcohol, or use illegal drugs. Some items may interact with your medicine. What should I watch for while using this medicine? You may need blood work done while you are taking this medicine. If you are going to need a MRI, CT scan, or other procedure, tell your doctor that you are using this medicine (On-Body Injector only). What side effects may I notice from receiving this medicine? Side effects that you should report to your doctor or health care professional as soon as possible: -allergic reactions like skin rash, itching or hives, swelling of the face, lips, or tongue -dizziness -fever -pain, redness, or irritation at site where injected -pinpoint red spots on the skin -shortness of breath or breathing problems -stomach or side pain, or pain at the shoulder -swelling -tiredness -trouble passing urine Side effects that usually do not require medical attention (report to your doctor   or health care professional if they continue or are bothersome): -bone pain -muscle pain This list may not describe all possible side effects. Call your doctor for medical advice about side effects. You may report side effects to FDA at  1-800-FDA-1088. Where should I keep my medicine? Keep out of the reach of children. Store pre-filled syringes in a refrigerator between 2 and 8 degrees C (36 and 46 degrees F). Do not freeze. Keep in carton to protect from light. Throw away this medicine if it is left out of the refrigerator for more than 48 hours. Throw away any unused medicine after the expiration date. NOTE: This sheet is a summary. It may not cover all possible information. If you have questions about this medicine, talk to your doctor, pharmacist, or health care provider.  2015, Elsevier/Gold Standard. (2013-09-05 16:14:05)  

## 2014-05-21 ENCOUNTER — Encounter: Payer: Self-pay | Admitting: Family

## 2014-05-22 ENCOUNTER — Encounter (HOSPITAL_COMMUNITY): Payer: Medicaid Other

## 2014-05-22 ENCOUNTER — Encounter: Payer: Self-pay | Admitting: Family

## 2014-05-22 ENCOUNTER — Ambulatory Visit (INDEPENDENT_AMBULATORY_CARE_PROVIDER_SITE_OTHER): Payer: Medicaid Other | Admitting: Family

## 2014-05-22 VITALS — BP 110/76 | HR 58 | Resp 16 | Ht 72.0 in | Wt 191.0 lb

## 2014-05-22 DIAGNOSIS — I739 Peripheral vascular disease, unspecified: Secondary | ICD-10-CM

## 2014-05-22 NOTE — Patient Instructions (Addendum)
Peripheral Vascular Disease Peripheral Vascular Disease (PVD), also called Peripheral Arterial Disease (PAD), is a circulation problem caused by cholesterol (atherosclerotic plaque) deposits in the arteries. PVD commonly occurs in the lower extremities (legs) but it can occur in other areas of the body, such as your arms. The cholesterol buildup in the arteries reduces blood flow which can cause pain and other serious problems. The presence of PVD can place a person at risk for Coronary Artery Disease (CAD).  CAUSES  Causes of PVD can be many. It is usually associated with more than one risk factor such as:   High Cholesterol.  Smoking.  Diabetes.  Lack of exercise or inactivity.  High blood pressure (hypertension).  Obesity.  Family history. SYMPTOMS   When the lower extremities are affected, patients with PVD may experience:  Leg pain with exertion or physical activity. This is called INTERMITTENT CLAUDICATION. This may present as cramping or numbness with physical activity. The location of the pain is associated with the level of blockage. For example, blockage at the abdominal level (distal abdominal aorta) may result in buttock or hip pain. Lower leg arterial blockage may result in calf pain.  As PVD becomes more severe, pain can develop with less physical activity.  In people with severe PVD, leg pain may occur at rest.  Other PVD signs and symptoms:  Leg numbness or weakness.  Coldness in the affected leg or foot, especially when compared to the other leg.  A change in leg color.  Patients with significant PVD are more prone to ulcers or sores on toes, feet or legs. These may take longer to heal or may reoccur. The ulcers or sores can become infected.  If signs and symptoms of PVD are ignored, gangrene may occur. This can result in the loss of toes or loss of an entire limb.  Not all leg pain is related to PVD. Other medical conditions can cause leg pain such  as:  Blood clots (embolism) or Deep Vein Thrombosis.  Inflammation of the blood vessels (vasculitis).  Spinal stenosis. DIAGNOSIS  Diagnosis of PVD can involve several different types of tests. These can include:  Pulse Volume Recording Method (PVR). This test is simple, painless and does not involve the use of X-rays. PVR involves measuring and comparing the blood pressure in the arms and legs. An ABI (Ankle-Brachial Index) is calculated. The normal ratio of blood pressures is 1. As this number becomes smaller, it indicates more severe disease.  < 0.95 - indicates significant narrowing in one or more leg vessels.  <0.8 - there will usually be pain in the foot, leg or buttock with exercise.  <0.4 - will usually have pain in the legs at rest.  <0.25 - usually indicates limb threatening PVD.  Doppler detection of pulses in the legs. This test is painless and checks to see if you have a pulses in your legs/feet.  A dye or contrast material (a substance that highlights the blood vessels so they show up on x-ray) may be given to help your caregiver better see the arteries for the following tests. The dye is eliminated from your body by the kidney's. Your caregiver may order blood work to check your kidney function and other laboratory values before the following tests are performed:  Magnetic Resonance Angiography (MRA). An MRA is a picture study of the blood vessels and arteries. The MRA machine uses a large magnet to produce images of the blood vessels.  Computed Tomography Angiography (CTA). A CTA   is a specialized x-ray that looks at how the blood flows in your blood vessels. An IV may be inserted into your arm so contrast dye can be injected.  Angiogram. Is a procedure that uses x-rays to look at your blood vessels. This procedure is minimally invasive, meaning a small incision (cut) is made in your groin. A small tube (catheter) is then inserted into the artery of your groin. The catheter  is guided to the blood vessel or artery your caregiver wants to examine. Contrast dye is injected into the catheter. X-rays are then taken of the blood vessel or artery. After the images are obtained, the catheter is taken out. TREATMENT  Treatment of PVD involves many interventions which may include:  Lifestyle changes:  Quitting smoking.  Exercise.  Following a low fat, low cholesterol diet.  Control of diabetes.  Foot care is very important to the PVD patient. Good foot care can help prevent infection.  Medication:  Cholesterol-lowering medicine.  Blood pressure medicine.  Anti-platelet drugs.  Certain medicines may reduce symptoms of Intermittent Claudication.  Interventional/Surgical options:  Angioplasty. An Angioplasty is a procedure that inflates a balloon in the blocked artery. This opens the blocked artery to improve blood flow.  Stent Implant. A wire mesh tube (stent) is placed in the artery. The stent expands and stays in place, allowing the artery to remain open.  Peripheral Bypass Surgery. This is a surgical procedure that reroutes the blood around a blocked artery to help improve blood flow. This type of procedure may be performed if Angioplasty or stent implants are not an option. SEEK IMMEDIATE MEDICAL CARE IF:   You develop pain or numbness in your arms or legs.  Your arm or leg turns cold, becomes blue in color.  You develop redness, warmth, swelling and pain in your arms or legs. MAKE SURE YOU:   Understand these instructions.  Will watch your condition.  Will get help right away if you are not doing well or get worse. Document Released: 07/14/2004 Document Revised: 08/29/2011 Document Reviewed: 06/10/2008 ExitCare Patient Information 2015 [employer]. This information is not intended to replace advice given to you by your health care provider. Make sure you discuss any questions you have with your health care provider.    Smoking  Cessation Quitting smoking is important to your health and has many advantages. However, it is not always easy to quit since nicotine is a very addictive drug. Oftentimes, people try 3 times or more before being able to quit. This document explains the best ways for you to prepare to quit smoking. Quitting takes hard work and a lot of effort, but you can do it. ADVANTAGES OF QUITTING SMOKING  You will live longer, feel better, and live better.  Your body will feel the impact of quitting smoking almost immediately.  Within 20 minutes, blood pressure decreases. Your pulse returns to its normal level.  After 8 hours, carbon monoxide levels in the blood return to normal. Your oxygen level increases.  After 24 hours, the chance of having a heart attack starts to decrease. Your breath, hair, and body stop smelling like smoke.  After 48 hours, damaged nerve endings begin to recover. Your sense of taste and smell improve.  After 72 hours, the body is virtually free of nicotine. Your bronchial tubes relax and breathing becomes easier.  After 2 to 12 weeks, lungs can hold more air. Exercise becomes easier and circulation improves.  The risk of having a heart attack, stroke,   cancer, or lung disease is greatly reduced.  After 1 year, the risk of coronary heart disease is cut in half.  After 5 years, the risk of stroke falls to the same as a nonsmoker.  After 10 years, the risk of lung cancer is cut in half and the risk of other cancers decreases significantly.  After 15 years, the risk of coronary heart disease drops, usually to the level of a nonsmoker.  If you are pregnant, quitting smoking will improve your chances of having a healthy baby.  The people you live with, especially any children, will be healthier.  You will have extra money to spend on things other than cigarettes. QUESTIONS TO THINK ABOUT BEFORE ATTEMPTING TO QUIT You may want to talk about your answers with your health care  provider.  Why do you want to quit?  If you tried to quit in the past, what helped and what did not?  What will be the most difficult situations for you after you quit? How will you plan to handle them?  Who can help you through the tough times? Your family? Friends? A health care provider?  What pleasures do you get from smoking? What ways can you still get pleasure if you quit? Here are some questions to ask your health care provider:  How can you help me to be successful at quitting?  What medicine do you think would be best for me and how should I take it?  What should I do if I need more help?  What is smoking withdrawal like? How can I get information on withdrawal? GET READY  Set a quit date.  Change your environment by getting rid of all cigarettes, ashtrays, matches, and lighters in your home, car, or work. Do not let people smoke in your home.  Review your past attempts to quit. Think about what worked and what did not. GET SUPPORT AND ENCOURAGEMENT You have a better chance of being successful if you have help. You can get support in many ways.  Tell your family, friends, and coworkers that you are going to quit and need their support. Ask them not to smoke around you.  Get individual, group, or telephone counseling and support. Programs are available at local hospitals and health centers. Call your local health department for information about programs in your area.  Spiritual beliefs and practices may help some smokers quit.  Download a "quit meter" on your computer to keep track of quit statistics, such as how long you have gone without smoking, cigarettes not smoked, and money saved.  Get a self-help book about quitting smoking and staying off tobacco. LEARN NEW SKILLS AND BEHAVIORS  Distract yourself from urges to smoke. Talk to someone, go for a walk, or occupy your time with a task.  Change your normal routine. Take a different route to work. Drink tea  instead of coffee. Eat breakfast in a different place.  Reduce your stress. Take a hot bath, exercise, or read a book.  Plan something enjoyable to do every day. Reward yourself for not smoking.  Explore interactive web-based programs that specialize in helping you quit. GET MEDICINE AND USE IT CORRECTLY Medicines can help you stop smoking and decrease the urge to smoke. Combining medicine with the above behavioral methods and support can greatly increase your chances of successfully quitting smoking.  Nicotine replacement therapy helps deliver nicotine to your body without the negative effects and risks of smoking. Nicotine replacement therapy includes nicotine gum, lozenges,   inhalers, nasal sprays, and skin patches. Some may be available over-the-counter and others require a prescription.  Antidepressant medicine helps people abstain from smoking, but how this works is unknown. This medicine is available by prescription.  Nicotinic receptor partial agonist medicine simulates the effect of nicotine in your brain. This medicine is available by prescription. Ask your health care provider for advice about which medicines to use and how to use them based on your health history. Your health care provider will tell you what side effects to look out for if you choose to be on a medicine or therapy. Carefully read the information on the package. Do not use any other product containing nicotine while using a nicotine replacement product.  RELAPSE OR DIFFICULT SITUATIONS Most relapses occur within the first 3 months after quitting. Do not be discouraged if you start smoking again. Remember, most people try several times before finally quitting. You may have symptoms of withdrawal because your body is used to nicotine. You may crave cigarettes, be irritable, feel very hungry, cough often, get headaches, or have difficulty concentrating. The withdrawal symptoms are only temporary. They are strongest when you  first quit, but they will go away within 10-14 days. To reduce the chances of relapse, try to:  Avoid drinking alcohol. Drinking lowers your chances of successfully quitting.  Reduce the amount of caffeine you consume. Once you quit smoking, the amount of caffeine in your body increases and can give you symptoms, such as a rapid heartbeat, sweating, and anxiety.  Avoid smokers because they can make you want to smoke.  Do not let weight gain distract you. Many smokers will gain weight when they quit, usually less than 10 pounds. Eat a healthy diet and stay active. You can always lose the weight gained after you quit.  Find ways to improve your mood other than smoking. FOR MORE INFORMATION  www.smokefree.gov  Document Released: 05/31/2001 Document Revised: 10/21/2013 Document Reviewed: 09/15/2011 ExitCare Patient Information 2015 [employer]. This information is not intended to replace advice given to you by your health care provider. Make sure you discuss any questions you have with your health care provider.    Smoking Cessation, Tips for Success If you are ready to quit smoking, congratulations! You have chosen to help yourself be healthier. Cigarettes bring nicotine, tar, carbon monoxide, and other irritants into your body. Your lungs, heart, and blood vessels will be able to work better without these poisons. There are many different ways to quit smoking. Nicotine gum, nicotine patches, a nicotine inhaler, or nicotine nasal spray can help with physical craving. Hypnosis, support groups, and medicines help break the habit of smoking. WHAT THINGS CAN I DO TO MAKE QUITTING EASIER?  Here are some tips to help you quit for good:  Pick a date when you will quit smoking completely. Tell all of your friends and family about your plan to quit on that date.  Do not try to slowly cut down on the number of cigarettes you are smoking. Pick a quit date and quit smoking completely starting on that  day.  Throw away all cigarettes.   Clean and remove all ashtrays from your home, work, and car.  On a card, write down your reasons for quitting. Carry the card with you and read it when you get the urge to smoke.  Cleanse your body of nicotine. Drink enough water and fluids to keep your urine clear or pale yellow. Do this after quitting to flush the nicotine from   your body.  Learn to predict your moods. Do not let a bad situation be your excuse to have a cigarette. Some situations in your life might tempt you into wanting a cigarette.  Never have "just one" cigarette. It leads to wanting another and another. Remind yourself of your decision to quit.  Change habits associated with smoking. If you smoked while driving or when feeling stressed, try other activities to replace smoking. Stand up when drinking your coffee. Brush your teeth after eating. Sit in a different chair when you read the paper. Avoid alcohol while trying to quit, and try to drink fewer caffeinated beverages. Alcohol and caffeine may urge you to smoke.  Avoid foods and drinks that can trigger a desire to smoke, such as sugary or spicy foods and alcohol.  Ask people who smoke not to smoke around you.  Have something planned to do right after eating or having a cup of coffee. For example, plan to take a walk or exercise.  Try a relaxation exercise to calm you down and decrease your stress. Remember, you may be tense and nervous for the first 2 weeks after you quit, but this will pass.  Find new activities to keep your hands busy. Play with a pen, coin, or rubber band. Doodle or draw things on paper.  Brush your teeth right after eating. This will help cut down on the craving for the taste of tobacco after meals. You can also try mouthwash.   Use oral substitutes in place of cigarettes. Try using lemon drops, carrots, cinnamon sticks, or chewing gum. Keep them handy so they are available when you have the urge to  smoke.  When you have the urge to smoke, try deep breathing.  Designate your home as a nonsmoking area.  If you are a heavy smoker, ask your health care provider about a prescription for nicotine chewing gum. It can ease your withdrawal from nicotine.  Reward yourself. Set aside the cigarette money you save and buy yourself something nice.  Look for support from others. Join a support group or smoking cessation program. Ask someone at home or at work to help you with your plan to quit smoking.  Always ask yourself, "Do I need this cigarette or is this just a reflex?" Tell yourself, "Today, I choose not to smoke," or "I do not want to smoke." You are reminding yourself of your decision to quit.  Do not replace cigarette smoking with electronic cigarettes (commonly called e-cigarettes). The safety of e-cigarettes is unknown, and some may contain harmful chemicals.  If you relapse, do not give up! Plan ahead and think about what you will do the next time you get the urge to smoke. HOW WILL I FEEL WHEN I QUIT SMOKING? You may have symptoms of withdrawal because your body is used to nicotine (the addictive substance in cigarettes). You may crave cigarettes, be irritable, feel very hungry, cough often, get headaches, or have difficulty concentrating. The withdrawal symptoms are only temporary. They are strongest when you first quit but will go away within 10-14 days. When withdrawal symptoms occur, stay in control. Think about your reasons for quitting. Remind yourself that these are signs that your body is healing and getting used to being without cigarettes. Remember that withdrawal symptoms are easier to treat than the major diseases that smoking can cause.  Even after the withdrawal is over, expect periodic urges to smoke. However, these cravings are generally short lived and will go away whether you   smoke or not. Do not smoke! WHAT RESOURCES ARE AVAILABLE TO HELP ME QUIT SMOKING? Your health care  provider can direct you to community resources or hospitals for support, which may include:  Group support.  Education.  Hypnosis.  Therapy. Document Released: 03/04/2004 Document Revised: 10/21/2013 Document Reviewed: 11/22/2012 ExitCare Patient Information 2015 [employer]. This information is not intended to replace advice given to you by your health care provider. Make sure you discuss any questions you have with your health care provider.  

## 2014-05-22 NOTE — Progress Notes (Signed)
[facility] HISTORY AND PHYSICAL -PAD  History of Present Illness Rick Davenport is a 53 y.o. male patient of Dr. 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 The patient was seen in the hospital after a colon resection. 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 He had minimal symptoms at that point. He continues to deny any claudication symptoms. He has no rest pain. He has no nonhealing wounds. Other medical problems include colon cancer with omental metastasis currently stable and followed by Dr. Benay Spice. His insurance did not approve ABI's until seen by a medical provider which is why he is here today.  Pt has not had previous PAD intervention. Pt denies any history of stroke or TIA, denies any cardiac problems. Pt states that he walks about an hour daily.  The patient denies New Medical or Surgical History.  Pt Diabetic: No Pt smoker: smoker  (1 cigarette/day, decreased from 2 ppd, started at age 14 yrs)  Pt meds include: Statin :No Betablocker: No ASA: No Other anticoagulants/antiplatelets: no  Past Medical History  Diagnosis Date  . Cancer 09/2013    colon ca    Social History History  Substance Use Topics  . Smoking status: Light Tobacco Smoker -- 1.00 packs/day for 40 years    Types: Cigarettes  . Smokeless tobacco: Never Used     Comment: pt states that he is trying to quit smoking  . Alcohol Use: Yes     Comment: 1 can of beer per day    Family History Family History  Problem Relation Age of Onset  . Diabetes Mother   . Lung cancer Father   . Prostate cancer Brother     Oldest brother    Past Surgical History  Procedure Laterality Date  . Partial colectomy Right 10/04/2013    Procedure: RIGHT HEMICOLECTOMY ;  Surgeon: Imogene Burn. Georgette Dover, MD;  Location: [facility];  Service: General;  Laterality: Right;    No Known Allergies  Current Outpatient Prescriptions   Medication Sig Dispense Refill  . lidocaine-prilocaine (EMLA) cream Apply 1 application topically as needed. Apply to port 1 hour before treatment and cover with plastic wrap 30 g 3  . ferrous sulfate 325 (65 FE) MG EC tablet Take 1 tablet (325 mg total) by mouth 2 (two) times daily with a meal. (Patient not taking: Reported on 05/22/2014) 60 tablet 3  . prochlorperazine (COMPAZINE) 10 MG tablet Take 1 tablet (10 mg total) by mouth every 6 (six) hours as needed for nausea or vomiting. (Patient not taking: Reported on 05/22/2014) 30 tablet 2   No current facility-administered medications for this visit.    ROS: See HPI for pertinent positives and negatives.   Physical Examination  Filed Vitals:   05/22/14 1054  BP: 110/76  Pulse: 58  Resp: 16  Height: 6' (1.829 m)  Weight: 191 lb (86.637 kg)  SpO2: 100%   Body mass index is 25.9 kg/(m^2).  General: A&O x 3, WDWN. Gait: normal Eyes: PERRLA. Pulmonary: CTAB, without wheezes , rales or rhonchi, diminished air movement. Cardiac: regular Rythm , without detected murmur.         Carotid Bruits Right Left   Negative Negative  Aorta is not palpable. Radial pulses: 2+ palpable and =                           VASCULAR EXAM: Extremities without  ischemic changes  without Gangrene; without open wounds.                                                                                                          LE Pulses Right Left       FEMORAL  2+ palpable  2+ palpable        POPLITEAL  not palpable   not palpable       POSTERIOR TIBIAL  2+ palpable   1+ palpable        DORSALIS PEDIS      ANTERIOR TIBIAL 2+ palpable  2+ palpable    Abdomen: soft, NT, no palpated masses. Skin: no rashes, no ulcers noted. Musculoskeletal: no muscle wasting or atrophy.  Neurologic: A&O X 3; Appropriate Affect ; SENSATION: normal; MOTOR FUNCTION:  moving all extremities equally, motor strength 5/5 throughout. Speech is fluent/normal. CN 2-12  intact.   ASSESSMENT: Rick Davenport is a 53 y.o. 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 The patient was seen in the hospital after a colon resection. 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 He had minimal symptoms at that point. He continues to deny any claudication symptoms. He has no rest pain. He has no nonhealing wounds. Other medical problems include colon cancer with omental metastasis currently stable and followed by Dr. Benay Spice. His insurance did not approve ABI's until seen by a medical provider which is why he is here today. Pt states he has no allergy to ASA, no bleeding nor GI issues as contraindication to taking a daily 81 mg ASA. Unfortunately he continues to smoke but at a decreased rate. See Plan.  PLAN:  Start 81 mg daily ASA. The patient was counseled re smoking cessation and given several free resources re smoking cessation.  I discussed in depth with the patient the nature of atherosclerosis, and emphasized the importance of maximal medical management including strict control of blood pressure, blood glucose, and lipid levels, obtaining regular exercise, and cessation of smoking.  The patient is aware that without maximal medical management the underlying atherosclerotic disease process will progress, limiting the benefit of any interventions.  Based on the patient's vascular studies and examination, pt will return to clinic in 6 weeks weeks with ABI's and see NP.  The patient was given information about PAD including signs, symptoms, treatment, what symptoms should prompt the patient to seek immediate medical care, and risk reduction measures to take.  Clemon Chambers, RN, MSN, FNP-C [facility] Phone: [phone]  Clinic MD: [facility]  05/22/2014 11:08 AM

## 2014-06-09 ENCOUNTER — Ambulatory Visit (HOSPITAL_BASED_OUTPATIENT_CLINIC_OR_DEPARTMENT_OTHER): Payer: Medicaid Other | Admitting: Lab

## 2014-06-09 ENCOUNTER — Telehealth: Payer: Self-pay | Admitting: Oncology

## 2014-06-09 ENCOUNTER — Ambulatory Visit (HOSPITAL_BASED_OUTPATIENT_CLINIC_OR_DEPARTMENT_OTHER): Payer: Medicaid Other | Admitting: Oncology

## 2014-06-09 VITALS — BP 104/75 | HR 55 | Temp 97.7°F | Resp 18 | Ht 72.0 in | Wt 191.8 lb

## 2014-06-09 DIAGNOSIS — D509 Iron deficiency anemia, unspecified: Secondary | ICD-10-CM

## 2014-06-09 DIAGNOSIS — C786 Secondary malignant neoplasm of retroperitoneum and peritoneum: Secondary | ICD-10-CM

## 2014-06-09 DIAGNOSIS — C180 Malignant neoplasm of cecum: Secondary | ICD-10-CM

## 2014-06-09 DIAGNOSIS — C772 Secondary and unspecified malignant neoplasm of intra-abdominal lymph nodes: Secondary | ICD-10-CM

## 2014-06-09 DIAGNOSIS — C189 Malignant neoplasm of colon, unspecified: Secondary | ICD-10-CM

## 2014-06-09 LAB — CBC WITH DIFFERENTIAL/PLATELET
BASO%: 0.5 % (ref 0.0–2.0)
BASOS ABS: 0 10*3/uL (ref 0.0–0.1)
EOS%: 3.3 % (ref 0.0–7.0)
Eosinophils Absolute: 0.1 10*3/uL (ref 0.0–0.5)
HCT: 39.7 % (ref 38.4–49.9)
HEMOGLOBIN: 12.3 g/dL — AB (ref 13.0–17.1)
LYMPH#: 1.3 10*3/uL (ref 0.9–3.3)
LYMPH%: 32 % (ref 14.0–49.0)
MCH: 25.6 pg — ABNORMAL LOW (ref 27.2–33.4)
MCHC: 31.1 g/dL — AB (ref 32.0–36.0)
MCV: 82.3 fL (ref 79.3–98.0)
MONO#: 0.4 10*3/uL (ref 0.1–0.9)
MONO%: 10.7 % (ref 0.0–14.0)
NEUT#: 2.2 10*3/uL (ref 1.5–6.5)
NEUT%: 53.5 % (ref 39.0–75.0)
Platelets: 121 10*3/uL — ABNORMAL LOW (ref 140–400)
RBC: 4.82 10*6/uL (ref 4.20–5.82)
RDW: 19.1 % — AB (ref 11.0–14.6)
WBC: 4.2 10*3/uL (ref 4.0–10.3)

## 2014-06-09 NOTE — Telephone Encounter (Signed)
gv and printeda ppt sched and avs for pt for Jan Feb and March 2016

## 2014-06-09 NOTE — Progress Notes (Signed)
  [facility] OFFICE PROGRESS NOTE   Diagnosis: Colon cancer  INTERVAL HISTORY:   Rick Davenport returns as scheduled. He completed a final cycle of FOLFOX 04/28/2014. He tolerated chemotherapy well. No neuropathy symptoms. No complaint.  Objective:  Vital signs in last 24 hours:  Blood pressure 104/75, pulse 55, temperature 97.7 F (36.5 C), temperature source Oral, resp. rate 18, height 6' (1.829 m), weight 191 lb 12.8 oz (87 kg), SpO2 100 %.    HEENT: No thrush or ulcers Resp: Lungs clear bilaterally Cardio: Regular rate and rhythm GI: No hepatomegaly, nontender Vascular: No leg edema  Skin: Mild hyperpigmentation and skin thickening over the hands   Portacath/PICC-without erythema  Lab Results:  Lab Results  Component Value Date   WBC 4.2 06/09/2014   HGB 12.3* 06/09/2014   HCT 39.7 06/09/2014   MCV 82.3 06/09/2014   PLT 121* 06/09/2014   NEUTROABS 2.2 06/09/2014     Medications: I have reviewed the patient's current medications.  Assessment/Plan: 1. Clinical stage IV (T4, N2b,M1a) adenocarcinoma of the cecum, status post a right colectomy 10/04/2013.  No loss of mismatch repair protein expression, microsatellite stable   Omental nodule resected at the time of surgery 10/04/2013 with the pathology confirming a lymph node metastasis.   Chest CT 10/30/2013 with no definite signs of metastatic disease. 4 mm pulmonary nodule lateral segment of the right middle lobe.   Initiation of FOLFOX chemotherapy 11/18/2013.  Cycle 12 FOLFOX 04/28/2014. 2. Microcytic anemia-likely iron deficiency anemia. Improved. He continues oral iron. 3. History of tobacco use. 4. 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 Oneida Alar. 5. History of Mild neutropenia 01/15/2014. Neulasta added beginning with cycle 5.  Disposition:  Mr. Speciale has completed the planned course of "adjuvant "chemotherapy. The Port-A-Cath will remain in place  until he undergoes restaging CT scans in March. The Port-A-Cath will be flushed every 6 weeks. He will return for an office visit after a restaging CT evaluation in March 2016.  We will consider removing the Port-A-Cath if there is no evidence of disease progression on the surveillance CT scans.  Betsy Coder, MD  06/09/2014  11:50 AM

## 2014-06-10 LAB — CEA

## 2014-06-11 ENCOUNTER — Other Ambulatory Visit: Payer: Medicaid Other

## 2014-06-11 ENCOUNTER — Ambulatory Visit (HOSPITAL_BASED_OUTPATIENT_CLINIC_OR_DEPARTMENT_OTHER): Payer: Medicaid Other

## 2014-06-11 DIAGNOSIS — Z95828 Presence of other vascular implants and grafts: Secondary | ICD-10-CM

## 2014-06-11 DIAGNOSIS — C180 Malignant neoplasm of cecum: Secondary | ICD-10-CM

## 2014-06-11 DIAGNOSIS — Z452 Encounter for adjustment and management of vascular access device: Secondary | ICD-10-CM

## 2014-06-11 MED ORDER — HEPARIN SOD (PORK) LOCK FLUSH 100 UNIT/ML IV SOLN
500.0000 [IU] | Freq: Once | INTRAVENOUS | Status: AC
  Administered 2014-06-11: 500 [IU] via INTRAVENOUS
  Filled 2014-06-11: qty 5

## 2014-06-11 MED ORDER — SODIUM CHLORIDE 0.9 % IJ SOLN
10.0000 mL | INTRAMUSCULAR | Status: DC | PRN
  Administered 2014-06-11: 10 mL via INTRAVENOUS
  Filled 2014-06-11: qty 10

## 2014-06-11 NOTE — Patient Instructions (Signed)

## 2014-07-02 ENCOUNTER — Encounter: Payer: Self-pay | Admitting: Family

## 2014-07-03 ENCOUNTER — Encounter (HOSPITAL_COMMUNITY): Payer: Medicaid Other

## 2014-07-03 ENCOUNTER — Ambulatory Visit: Payer: Medicaid Other | Admitting: Family

## 2014-07-23 ENCOUNTER — Other Ambulatory Visit: Payer: Medicaid Other

## 2014-07-23 ENCOUNTER — Encounter: Payer: Self-pay | Admitting: Family

## 2014-07-23 ENCOUNTER — Ambulatory Visit (HOSPITAL_BASED_OUTPATIENT_CLINIC_OR_DEPARTMENT_OTHER): Payer: Medicaid Other

## 2014-07-23 VITALS — BP 103/73 | HR 97 | Temp 98.5°F

## 2014-07-23 DIAGNOSIS — Z95828 Presence of other vascular implants and grafts: Secondary | ICD-10-CM

## 2014-07-23 DIAGNOSIS — Z452 Encounter for adjustment and management of vascular access device: Secondary | ICD-10-CM

## 2014-07-23 DIAGNOSIS — C180 Malignant neoplasm of cecum: Secondary | ICD-10-CM

## 2014-07-23 MED ORDER — HEPARIN SOD (PORK) LOCK FLUSH 100 UNIT/ML IV SOLN
500.0000 [IU] | Freq: Once | INTRAVENOUS | Status: AC
  Administered 2014-07-23: 500 [IU] via INTRAVENOUS
  Filled 2014-07-23: qty 5

## 2014-07-23 MED ORDER — SODIUM CHLORIDE 0.9 % IJ SOLN
10.0000 mL | INTRAMUSCULAR | Status: DC | PRN
  Administered 2014-07-23: 10 mL via INTRAVENOUS
  Filled 2014-07-23: qty 10

## 2014-07-23 NOTE — Patient Instructions (Signed)

## 2014-07-24 ENCOUNTER — Encounter: Payer: Self-pay | Admitting: Family

## 2014-07-24 ENCOUNTER — Ambulatory Visit (INDEPENDENT_AMBULATORY_CARE_PROVIDER_SITE_OTHER): Payer: Medicaid Other | Admitting: Family

## 2014-07-24 VITALS — BP 115/73 | HR 69 | Resp 16 | Ht 72.0 in | Wt 194.0 lb

## 2014-07-24 DIAGNOSIS — I739 Peripheral vascular disease, unspecified: Secondary | ICD-10-CM

## 2014-07-24 DIAGNOSIS — Z720 Tobacco use: Secondary | ICD-10-CM

## 2014-07-24 DIAGNOSIS — I771 Stricture of artery: Secondary | ICD-10-CM

## 2014-07-24 DIAGNOSIS — F17200 Nicotine dependence, unspecified, uncomplicated: Secondary | ICD-10-CM

## 2014-07-24 NOTE — Progress Notes (Signed)
[facility] HISTORY AND PHYSICAL -PAD  History of Present Illness Rick Davenport is a 54 y.o. male  patient of Dr. 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 The patient was seen in the hospital after a colon resection. 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 He had minimal symptoms at that point. He continues to deny any claudication symptoms. He has no rest pain. He has no nonhealing wounds. Other medical problems include colon cancer with omental metastasis currently stable and followed by Dr. Benay Spice. His insurance did not approve ABI's until seen by a medical provider which is why he is here today.  Pt has not had previous PAD intervention. Pt denies any history of stroke or TIA, denies any cardiac problems. Pt states that he walks about an hour daily.  The patient denies New Medical or Surgical History.  Pt Diabetic: No Pt smoker: smoker (1 cigarette/day, decreased from 2 ppd, started at age 33 yrs). Pt states he rarely uses ETOH  Pt meds include: Statin :No Betablocker: No ASA: not taking daily as advised Other anticoagulants/antiplatelets: no    Past Medical History  Diagnosis Date  . Cancer 09/2013    colon ca    Social History History  Substance Use Topics  . Smoking status: Light Tobacco Smoker -- 1.00 packs/day for 40 years    Types: Cigarettes  . Smokeless tobacco: Never Used     Comment: pt states that he is trying to quit smoking  . Alcohol Use: Yes     Comment: 1 can of beer per day    Family History Family History  Problem Relation Age of Onset  . Diabetes Mother   . Lung cancer Father   . Prostate cancer Brother     Oldest brother    Past Surgical History  Procedure Laterality Date  . Partial colectomy Right 10/04/2013    Procedure: RIGHT HEMICOLECTOMY ;  Surgeon: Imogene Burn. Georgette Dover, MD;  Location: [facility];  Service: General;  Laterality: Right;     No Known Allergies  Current Outpatient Prescriptions  Medication Sig Dispense Refill  . ferrous sulfate 325 (65 FE) MG EC tablet Take 1 tablet (325 mg total) by mouth 2 (two) times daily with a meal. 60 tablet 3  . lidocaine-prilocaine (EMLA) cream Apply 1 application topically as needed. Apply to port 1 hour before treatment and cover with plastic wrap 30 g 3  . prochlorperazine (COMPAZINE) 10 MG tablet Take 1 tablet (10 mg total) by mouth every 6 (six) hours as needed for nausea or vomiting. 30 tablet 2   No current facility-administered medications for this visit.    ROS: See HPI for pertinent positives and negatives.   Physical Examination  Filed Vitals:   07/24/14 0911  BP: 115/73  Pulse: 69  Resp: 16  Height: 6' (1.829 m)  Weight: 194 lb (87.998 kg)  SpO2: 99%   Body mass index is 26.31 kg/(m^2).  General: A&O x 3, WDWN. Gait: normal Eyes: PERRLA. Pulmonary: CTAB, without wheezes , rales or rhonchi, diminished air movement. Cardiac: regular Rythm , without detected murmur.     Carotid Bruits Right Left   Negative Negative  Aorta is not palpable. Radial pulses: 2+ palpable and =   VASCULAR EXAM: Extremities without ischemic changes  without Gangrene; without open wounds.     LE Pulses Right Left   FEMORAL 2+ palpable 2+ palpable    POPLITEAL not  palpable  not palpable   POSTERIOR TIBIAL 2+ palpable  1+ palpable    DORSALIS PEDIS  ANTERIOR TIBIAL 2+ palpable  2+ palpable    Abdomen: soft, NT, no palpated masses. Skin: no rashes, no ulcers. Musculoskeletal: no muscle wasting or atrophy. Neurologic: A&O X 3; Appropriate Affect ; SENSATION: normal; MOTOR FUNCTION: moving all extremities equally, motor strength 5/5 throughout.  Speech is fluent/normal. CN 2-12 intact.   ASSESSMENT: Rick Davenport is a 54 y.o. 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 The patient was seen in the hospital after a colon resection. 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 He had minimal symptoms at that point. He continues to deny any claudication symptoms. He has no rest pain. He has no nonhealing wounds. Other medical problems include colon cancer with omental metastasis currently stable and followed by Dr. Benay Spice. His insurance did not approve ABI's until seen by a medical provider which is why he is here today.  Pt has not had previous PAD intervention.   Unfortunately he continues to smoke. He admits to not taking a daily 81 mg ASA as advised at his last visit, advised to take daily to reduce risk of CVD event.  PLAN:  The patient was counseled re smoking cessation and given several free resources re smoking cessation. Take a daily 81 mg ASA. I discussed in depth with the patient the nature of atherosclerosis, and emphasized the importance of maximal medical management including strict control of blood pressure, blood glucose, and lipid levels, obtaining regular exercise, and cessation of smoking.  The patient is aware that without maximal medical management the underlying atherosclerotic disease process will progress, limiting the benefit of any interventions.  Based on the patient's vascular studies and examination, pt will return to clinic in 6 weeks with ABI's and see me to discuss results and implications.  The patient was given information about PAD including signs, symptoms, treatment, what symptoms should prompt the patient to seek immediate medical care, and risk reduction measures to take.  Clemon Chambers, RN, MSN, FNP-C [facility] Phone: [phone]  Clinic MD: Oneida Alar  07/24/2014  9:18 AM

## 2014-07-24 NOTE — Patient Instructions (Signed)
Peripheral Vascular Disease Peripheral Vascular Disease (PVD), also called Peripheral Arterial Disease (PAD), is a circulation problem caused by cholesterol (atherosclerotic plaque) deposits in the arteries. PVD commonly occurs in the lower extremities (legs) but it can occur in other areas of the body, such as your arms. The cholesterol buildup in the arteries reduces blood flow which can cause pain and other serious problems. The presence of PVD can place a person at risk for Coronary Artery Disease (CAD).  CAUSES  Causes of PVD can be many. It is usually associated with more than one risk factor such as:   High Cholesterol.  Smoking.  Diabetes.  Lack of exercise or inactivity.  High blood pressure (hypertension).  Obesity.  Family history. SYMPTOMS   When the lower extremities are affected, patients with PVD may experience:  Leg pain with exertion or physical activity. This is called INTERMITTENT CLAUDICATION. This may present as cramping or numbness with physical activity. The location of the pain is associated with the level of blockage. For example, blockage at the abdominal level (distal abdominal aorta) may result in buttock or hip pain. Lower leg arterial blockage may result in calf pain.  As PVD becomes more severe, pain can develop with less physical activity.  In people with severe PVD, leg pain may occur at rest.  Other PVD signs and symptoms:  Leg numbness or weakness.  Coldness in the affected leg or foot, especially when compared to the other leg.  A change in leg color.  Patients with significant PVD are more prone to ulcers or sores on toes, feet or legs. These may take longer to heal or may reoccur. The ulcers or sores can become infected.  If signs and symptoms of PVD are ignored, gangrene may occur. This can result in the loss of toes or loss of an entire limb.  Not all leg pain is related to PVD. Other medical conditions can cause leg pain such  as:  Blood clots (embolism) or Deep Vein Thrombosis.  Inflammation of the blood vessels (vasculitis).  Spinal stenosis. DIAGNOSIS  Diagnosis of PVD can involve several different types of tests. These can include:  Pulse Volume Recording Method (PVR). This test is simple, painless and does not involve the use of X-rays. PVR involves measuring and comparing the blood pressure in the arms and legs. An ABI (Ankle-Brachial Index) is calculated. The normal ratio of blood pressures is 1. As this number becomes smaller, it indicates more severe disease.  < 0.95 - indicates significant narrowing in one or more leg vessels.  <0.8 - there will usually be pain in the foot, leg or buttock with exercise.  <0.4 - will usually have pain in the legs at rest.  <0.25 - usually indicates limb threatening PVD.  Doppler detection of pulses in the legs. This test is painless and checks to see if you have a pulses in your legs/feet.  A dye or contrast material (a substance that highlights the blood vessels so they show up on x-ray) may be given to help your caregiver better see the arteries for the following tests. The dye is eliminated from your body by the kidney's. Your caregiver may order blood work to check your kidney function and other laboratory values before the following tests are performed:  Magnetic Resonance Angiography (MRA). An MRA is a picture study of the blood vessels and arteries. The MRA machine uses a large magnet to produce images of the blood vessels.  Computed Tomography Angiography (CTA). A CTA   is a specialized x-ray that looks at how the blood flows in your blood vessels. An IV may be inserted into your arm so contrast dye can be injected.  Angiogram. Is a procedure that uses x-rays to look at your blood vessels. This procedure is minimally invasive, meaning a small incision (cut) is made in your groin. A small tube (catheter) is then inserted into the artery of your groin. The catheter  is guided to the blood vessel or artery your caregiver wants to examine. Contrast dye is injected into the catheter. X-rays are then taken of the blood vessel or artery. After the images are obtained, the catheter is taken out. TREATMENT  Treatment of PVD involves many interventions which may include:  Lifestyle changes:  Quitting smoking.  Exercise.  Following a low fat, low cholesterol diet.  Control of diabetes.  Foot care is very important to the PVD patient. Good foot care can help prevent infection.  Medication:  Cholesterol-lowering medicine.  Blood pressure medicine.  Anti-platelet drugs.  Certain medicines may reduce symptoms of Intermittent Claudication.  Interventional/Surgical options:  Angioplasty. An Angioplasty is a procedure that inflates a balloon in the blocked artery. This opens the blocked artery to improve blood flow.  Stent Implant. A wire mesh tube (stent) is placed in the artery. The stent expands and stays in place, allowing the artery to remain open.  Peripheral Bypass Surgery. This is a surgical procedure that reroutes the blood around a blocked artery to help improve blood flow. This type of procedure may be performed if Angioplasty or stent implants are not an option. SEEK IMMEDIATE MEDICAL CARE IF:   You develop pain or numbness in your arms or legs.  Your arm or leg turns cold, becomes blue in color.  You develop redness, warmth, swelling and pain in your arms or legs. MAKE SURE YOU:   Understand these instructions.  Will watch your condition.  Will get help right away if you are not doing well or get worse. Document Released: 07/14/2004 Document Revised: 08/29/2011 Document Reviewed: 06/10/2008 ExitCare Patient Information 2015 [employer]. This information is not intended to replace advice given to you by your health care provider. Make sure you discuss any questions you have with your health care provider.    Smoking  Cessation Quitting smoking is important to your health and has many advantages. However, it is not always easy to quit since nicotine is a very addictive drug. Oftentimes, people try 3 times or more before being able to quit. This document explains the best ways for you to prepare to quit smoking. Quitting takes hard work and a lot of effort, but you can do it. ADVANTAGES OF QUITTING SMOKING  You will live longer, feel better, and live better.  Your body will feel the impact of quitting smoking almost immediately.  Within 20 minutes, blood pressure decreases. Your pulse returns to its normal level.  After 8 hours, carbon monoxide levels in the blood return to normal. Your oxygen level increases.  After 24 hours, the chance of having a heart attack starts to decrease. Your breath, hair, and body stop smelling like smoke.  After 48 hours, damaged nerve endings begin to recover. Your sense of taste and smell improve.  After 72 hours, the body is virtually free of nicotine. Your bronchial tubes relax and breathing becomes easier.  After 2 to 12 weeks, lungs can hold more air. Exercise becomes easier and circulation improves.  The risk of having a heart attack, stroke,   cancer, or lung disease is greatly reduced.  After 1 year, the risk of coronary heart disease is cut in half.  After 5 years, the risk of stroke falls to the same as a nonsmoker.  After 10 years, the risk of lung cancer is cut in half and the risk of other cancers decreases significantly.  After 15 years, the risk of coronary heart disease drops, usually to the level of a nonsmoker.  If you are pregnant, quitting smoking will improve your chances of having a healthy baby.  The people you live with, especially any children, will be healthier.  You will have extra money to spend on things other than cigarettes. QUESTIONS TO THINK ABOUT BEFORE ATTEMPTING TO QUIT You may want to talk about your answers with your health care  provider.  Why do you want to quit?  If you tried to quit in the past, what helped and what did not?  What will be the most difficult situations for you after you quit? How will you plan to handle them?  Who can help you through the tough times? Your family? Friends? A health care provider?  What pleasures do you get from smoking? What ways can you still get pleasure if you quit? Here are some questions to ask your health care provider:  How can you help me to be successful at quitting?  What medicine do you think would be best for me and how should I take it?  What should I do if I need more help?  What is smoking withdrawal like? How can I get information on withdrawal? GET READY  Set a quit date.  Change your environment by getting rid of all cigarettes, ashtrays, matches, and lighters in your home, car, or work. Do not let people smoke in your home.  Review your past attempts to quit. Think about what worked and what did not. GET SUPPORT AND ENCOURAGEMENT You have a better chance of being successful if you have help. You can get support in many ways.  Tell your family, friends, and coworkers that you are going to quit and need their support. Ask them not to smoke around you.  Get individual, group, or telephone counseling and support. Programs are available at local hospitals and health centers. Call your local health department for information about programs in your area.  Spiritual beliefs and practices may help some smokers quit.  Download a "quit meter" on your computer to keep track of quit statistics, such as how long you have gone without smoking, cigarettes not smoked, and money saved.  Get a self-help book about quitting smoking and staying off tobacco. LEARN NEW SKILLS AND BEHAVIORS  Distract yourself from urges to smoke. Talk to someone, go for a walk, or occupy your time with a task.  Change your normal routine. Take a different route to work. Drink tea  instead of coffee. Eat breakfast in a different place.  Reduce your stress. Take a hot bath, exercise, or read a book.  Plan something enjoyable to do every day. Reward yourself for not smoking.  Explore interactive web-based programs that specialize in helping you quit. GET MEDICINE AND USE IT CORRECTLY Medicines can help you stop smoking and decrease the urge to smoke. Combining medicine with the above behavioral methods and support can greatly increase your chances of successfully quitting smoking.  Nicotine replacement therapy helps deliver nicotine to your body without the negative effects and risks of smoking. Nicotine replacement therapy includes nicotine gum, lozenges,   inhalers, nasal sprays, and skin patches. Some may be available over-the-counter and others require a prescription.  Antidepressant medicine helps people abstain from smoking, but how this works is unknown. This medicine is available by prescription.  Nicotinic receptor partial agonist medicine simulates the effect of nicotine in your brain. This medicine is available by prescription. Ask your health care provider for advice about which medicines to use and how to use them based on your health history. Your health care provider will tell you what side effects to look out for if you choose to be on a medicine or therapy. Carefully read the information on the package. Do not use any other product containing nicotine while using a nicotine replacement product.  RELAPSE OR DIFFICULT SITUATIONS Most relapses occur within the first 3 months after quitting. Do not be discouraged if you start smoking again. Remember, most people try several times before finally quitting. You may have symptoms of withdrawal because your body is used to nicotine. You may crave cigarettes, be irritable, feel very hungry, cough often, get headaches, or have difficulty concentrating. The withdrawal symptoms are only temporary. They are strongest when you  first quit, but they will go away within 10-14 days. To reduce the chances of relapse, try to:  Avoid drinking alcohol. Drinking lowers your chances of successfully quitting.  Reduce the amount of caffeine you consume. Once you quit smoking, the amount of caffeine in your body increases and can give you symptoms, such as a rapid heartbeat, sweating, and anxiety.  Avoid smokers because they can make you want to smoke.  Do not let weight gain distract you. Many smokers will gain weight when they quit, usually less than 10 pounds. Eat a healthy diet and stay active. You can always lose the weight gained after you quit.  Find ways to improve your mood other than smoking. FOR MORE INFORMATION  www.smokefree.gov  Document Released: 05/31/2001 Document Revised: 10/21/2013 Document Reviewed: 09/15/2011 ExitCare Patient Information 2015 [employer]. This information is not intended to replace advice given to you by your health care provider. Make sure you discuss any questions you have with your health care provider.    Smoking Cessation, Tips for Success If you are ready to quit smoking, congratulations! You have chosen to help yourself be healthier. Cigarettes bring nicotine, tar, carbon monoxide, and other irritants into your body. Your lungs, heart, and blood vessels will be able to work better without these poisons. There are many different ways to quit smoking. Nicotine gum, nicotine patches, a nicotine inhaler, or nicotine nasal spray can help with physical craving. Hypnosis, support groups, and medicines help break the habit of smoking. WHAT THINGS CAN I DO TO MAKE QUITTING EASIER?  Here are some tips to help you quit for good:  Pick a date when you will quit smoking completely. Tell all of your friends and family about your plan to quit on that date.  Do not try to slowly cut down on the number of cigarettes you are smoking. Pick a quit date and quit smoking completely starting on that  day.  Throw away all cigarettes.   Clean and remove all ashtrays from your home, work, and car.  On a card, write down your reasons for quitting. Carry the card with you and read it when you get the urge to smoke.  Cleanse your body of nicotine. Drink enough water and fluids to keep your urine clear or pale yellow. Do this after quitting to flush the nicotine from   your body.  Learn to predict your moods. Do not let a bad situation be your excuse to have a cigarette. Some situations in your life might tempt you into wanting a cigarette.  Never have "just one" cigarette. It leads to wanting another and another. Remind yourself of your decision to quit.  Change habits associated with smoking. If you smoked while driving or when feeling stressed, try other activities to replace smoking. Stand up when drinking your coffee. Brush your teeth after eating. Sit in a different chair when you read the paper. Avoid alcohol while trying to quit, and try to drink fewer caffeinated beverages. Alcohol and caffeine may urge you to smoke.  Avoid foods and drinks that can trigger a desire to smoke, such as sugary or spicy foods and alcohol.  Ask people who smoke not to smoke around you.  Have something planned to do right after eating or having a cup of coffee. For example, plan to take a walk or exercise.  Try a relaxation exercise to calm you down and decrease your stress. Remember, you may be tense and nervous for the first 2 weeks after you quit, but this will pass.  Find new activities to keep your hands busy. Play with a pen, coin, or rubber band. Doodle or draw things on paper.  Brush your teeth right after eating. This will help cut down on the craving for the taste of tobacco after meals. You can also try mouthwash.   Use oral substitutes in place of cigarettes. Try using lemon drops, carrots, cinnamon sticks, or chewing gum. Keep them handy so they are available when you have the urge to  smoke.  When you have the urge to smoke, try deep breathing.  Designate your home as a nonsmoking area.  If you are a heavy smoker, ask your health care provider about a prescription for nicotine chewing gum. It can ease your withdrawal from nicotine.  Reward yourself. Set aside the cigarette money you save and buy yourself something nice.  Look for support from others. Join a support group or smoking cessation program. Ask someone at home or at work to help you with your plan to quit smoking.  Always ask yourself, "Do I need this cigarette or is this just a reflex?" Tell yourself, "Today, I choose not to smoke," or "I do not want to smoke." You are reminding yourself of your decision to quit.  Do not replace cigarette smoking with electronic cigarettes (commonly called e-cigarettes). The safety of e-cigarettes is unknown, and some may contain harmful chemicals.  If you relapse, do not give up! Plan ahead and think about what you will do the next time you get the urge to smoke. HOW WILL I FEEL WHEN I QUIT SMOKING? You may have symptoms of withdrawal because your body is used to nicotine (the addictive substance in cigarettes). You may crave cigarettes, be irritable, feel very hungry, cough often, get headaches, or have difficulty concentrating. The withdrawal symptoms are only temporary. They are strongest when you first quit but will go away within 10-14 days. When withdrawal symptoms occur, stay in control. Think about your reasons for quitting. Remind yourself that these are signs that your body is healing and getting used to being without cigarettes. Remember that withdrawal symptoms are easier to treat than the major diseases that smoking can cause.  Even after the withdrawal is over, expect periodic urges to smoke. However, these cravings are generally short lived and will go away whether you   smoke or not. Do not smoke! WHAT RESOURCES ARE AVAILABLE TO HELP ME QUIT SMOKING? Your health care  provider can direct you to community resources or hospitals for support, which may include:  Group support.  Education.  Hypnosis.  Therapy. Document Released: 03/04/2004 Document Revised: 10/21/2013 Document Reviewed: 11/22/2012 ExitCare Patient Information 2015 [employer]. This information is not intended to replace advice given to you by your health care provider. Make sure you discuss any questions you have with your health care provider.  

## 2014-08-06 ENCOUNTER — Other Ambulatory Visit: Payer: Medicaid Other

## 2014-08-11 ENCOUNTER — Ambulatory Visit: Payer: Medicaid Other | Admitting: Oncology

## 2014-09-03 ENCOUNTER — Encounter: Payer: Self-pay | Admitting: Family

## 2014-09-04 ENCOUNTER — Ambulatory Visit (INDEPENDENT_AMBULATORY_CARE_PROVIDER_SITE_OTHER)
Admission: RE | Admit: 2014-09-04 | Discharge: 2014-09-04 | Disposition: A | Discharge status: Home / Self Care | Payer: Medicaid Other | Source: Ambulatory Visit | Attending: Family | Admitting: Family

## 2014-09-04 ENCOUNTER — Encounter (HOSPITAL_COMMUNITY): Payer: Self-pay

## 2014-09-04 ENCOUNTER — Encounter: Payer: Self-pay | Admitting: Family

## 2014-09-04 ENCOUNTER — Ambulatory Visit (INDEPENDENT_AMBULATORY_CARE_PROVIDER_SITE_OTHER): Payer: Medicaid Other | Admitting: Family

## 2014-09-04 ENCOUNTER — Other Ambulatory Visit (HOSPITAL_BASED_OUTPATIENT_CLINIC_OR_DEPARTMENT_OTHER): Payer: Medicaid Other

## 2014-09-04 ENCOUNTER — Ambulatory Visit (HOSPITAL_COMMUNITY)
Admission: RE | Admit: 2014-09-04 | Discharge: 2014-09-04 | Disposition: A | Discharge status: Home / Self Care | Payer: Medicaid Other | Source: Ambulatory Visit | Attending: Oncology | Admitting: Oncology

## 2014-09-04 ENCOUNTER — Ambulatory Visit (HOSPITAL_BASED_OUTPATIENT_CLINIC_OR_DEPARTMENT_OTHER): Payer: Medicaid Other

## 2014-09-04 VITALS — BP 121/86 | HR 60 | Resp 14 | Ht 72.0 in | Wt 189.0 lb

## 2014-09-04 VITALS — BP 112/78 | HR 56 | Temp 97.6°F

## 2014-09-04 DIAGNOSIS — I739 Peripheral vascular disease, unspecified: Secondary | ICD-10-CM

## 2014-09-04 DIAGNOSIS — C189 Malignant neoplasm of colon, unspecified: Secondary | ICD-10-CM

## 2014-09-04 DIAGNOSIS — Z720 Tobacco use: Secondary | ICD-10-CM

## 2014-09-04 DIAGNOSIS — R911 Solitary pulmonary nodule: Secondary | ICD-10-CM | POA: Diagnosis not present

## 2014-09-04 DIAGNOSIS — Z85038 Personal history of other malignant neoplasm of large intestine: Secondary | ICD-10-CM | POA: Diagnosis present

## 2014-09-04 DIAGNOSIS — I771 Stricture of artery: Secondary | ICD-10-CM

## 2014-09-04 DIAGNOSIS — C180 Malignant neoplasm of cecum: Secondary | ICD-10-CM

## 2014-09-04 DIAGNOSIS — Z95828 Presence of other vascular implants and grafts: Secondary | ICD-10-CM

## 2014-09-04 DIAGNOSIS — F17200 Nicotine dependence, unspecified, uncomplicated: Secondary | ICD-10-CM

## 2014-09-04 LAB — CBC WITH DIFFERENTIAL/PLATELET
BASO%: 0.6 % (ref 0.0–2.0)
BASOS ABS: 0 10*3/uL (ref 0.0–0.1)
EOS ABS: 0.2 10*3/uL (ref 0.0–0.5)
EOS%: 4.5 % (ref 0.0–7.0)
HEMATOCRIT: 43.2 % (ref 38.4–49.9)
HEMOGLOBIN: 13.4 g/dL (ref 13.0–17.1)
LYMPH#: 1.5 10*3/uL (ref 0.9–3.3)
LYMPH%: 34.7 % (ref 14.0–49.0)
MCH: 25 pg — AB (ref 27.2–33.4)
MCHC: 31 g/dL — ABNORMAL LOW (ref 32.0–36.0)
MCV: 80.9 fL (ref 79.3–98.0)
MONO#: 0.3 10*3/uL (ref 0.1–0.9)
MONO%: 8 % (ref 0.0–14.0)
NEUT%: 52.2 % (ref 39.0–75.0)
NEUTROS ABS: 2.2 10*3/uL (ref 1.5–6.5)
Platelets: 175 10*3/uL (ref 140–400)
RBC: 5.35 10*6/uL (ref 4.20–5.82)
RDW: 17.1 % — ABNORMAL HIGH (ref 11.0–14.6)
WBC: 4.3 10*3/uL (ref 4.0–10.3)

## 2014-09-04 LAB — BASIC METABOLIC PANEL (CC13)
Anion Gap: 8 mEq/L (ref 3–11)
BUN: 10.9 mg/dL (ref 7.0–26.0)
CO2: 23 meq/L (ref 22–29)
CREATININE: 0.9 mg/dL (ref 0.7–1.3)
Calcium: 8.3 mg/dL — ABNORMAL LOW (ref 8.4–10.4)
Chloride: 109 mEq/L (ref 98–109)
EGFR: >90 mL/min/{1.73_m2} (ref >90)
Glucose: 83 mg/dl (ref 70–140)
Potassium: 4.3 mEq/L (ref 3.5–5.1)
SODIUM: 140 meq/L (ref 136–145)

## 2014-09-04 MED ORDER — IOHEXOL 300 MG/ML  SOLN
100.0000 mL | Freq: Once | INTRAMUSCULAR | Status: AC | PRN
  Administered 2014-09-04: 100 mL via INTRAVENOUS

## 2014-09-04 MED ORDER — IOHEXOL 300 MG/ML  SOLN
50.0000 mL | Freq: Once | INTRAMUSCULAR | Status: AC | PRN
  Administered 2014-09-04: 50 mL via ORAL

## 2014-09-04 MED ORDER — HEPARIN SOD (PORK) LOCK FLUSH 100 UNIT/ML IV SOLN
500.0000 [IU] | Freq: Once | INTRAVENOUS | Status: AC
  Administered 2014-09-04: 500 [IU] via INTRAVENOUS
  Filled 2014-09-04: qty 5

## 2014-09-04 MED ORDER — SODIUM CHLORIDE 0.9 % IJ SOLN
10.0000 mL | INTRAMUSCULAR | Status: DC | PRN
  Administered 2014-09-04: 10 mL via INTRAVENOUS
  Filled 2014-09-04: qty 10

## 2014-09-04 NOTE — Patient Instructions (Signed)

## 2014-09-04 NOTE — Patient Instructions (Signed)

## 2014-09-04 NOTE — Progress Notes (Signed)
[facility] HISTORY AND PHYSICAL -PAD  History of Present Illness Rick Davenport is a 54 y.o. male patient of Dr. 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 The patient was seen in the hospital after a colon resection. 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 He had minimal symptoms at that point. He continues to deny any claudication symptoms. He has no rest pain. He has no nonhealing wounds. Other medical problems include colon cancer with omental metastasis currently stable and followed by Dr. Benay Spice. His insurance did not approve ABI's until seen by a medical provider when he was here in February 2015,  he is here today for ABI's and discussion of results.  Pt has not had previous PAD intervention. Pt denies any history of stroke or TIA, denies any cardiac problems. Pt states that he walks about an hour daily.  The patient denies New Medical or Surgical History. Has no PCP.  Pt Diabetic: No Pt smoker: smoker (1 cigarette/day, decreased from 2 ppd, started at age 19 yrs), states he starts a smoking cessation class in April 2016. Pt states he rarely uses ETOH  Pt meds include: Statin :No Betablocker: No ASA: not taking daily as advised Other anticoagulants/antiplatelets: no   Past Medical History  Diagnosis Date  . Cancer 09/2013    colon ca    Social History History  Substance Use Topics  . Smoking status: Light Tobacco Smoker -- 1.00 packs/day for 40 years    Types: Cigarettes  . Smokeless tobacco: Never Used     Comment: pt states that he is trying to quit smoking  . Alcohol Use: 0.0 oz/week    0 Standard drinks or equivalent per week     Comment: 1 can of beer per day    Family History Family History  Problem Relation Age of Onset  . Diabetes Mother   . Lung cancer Father   . Prostate cancer Brother     Oldest brother    Past Surgical History  Procedure  Laterality Date  . Partial colectomy Right 10/04/2013    Procedure: RIGHT HEMICOLECTOMY ;  Surgeon: Imogene Burn. Georgette Dover, MD;  Location: [facility];  Service: General;  Laterality: Right;    No Known Allergies  Current Outpatient Prescriptions  Medication Sig Dispense Refill  . ferrous sulfate 325 (65 FE) MG EC tablet Take 1 tablet (325 mg total) by mouth 2 (two) times daily with a meal. 60 tablet 3  . lidocaine-prilocaine (EMLA) cream Apply 1 application topically as needed. Apply to port 1 hour before treatment and cover with plastic wrap (Patient not taking: Reported on 09/04/2014) 30 g 3  . prochlorperazine (COMPAZINE) 10 MG tablet Take 1 tablet (10 mg total) by mouth every 6 (six) hours as needed for nausea or vomiting. (Patient not taking: Reported on 09/04/2014) 30 tablet 2   No current facility-administered medications for this visit.    ROS: See HPI for pertinent positives and negatives.   Physical Examination  Filed Vitals:   09/04/14 1458  BP: 121/86  Pulse: 60  Resp: 14  Height: 6' (1.829 m)  Weight: 189 lb (85.73 kg)   Body mass index is 25.63 kg/(m^2).  General: A&O x 3, WDWN. Gait: normal Eyes: PERRLA. Pulmonary: CTAB, without wheezes , rales or rhonchi, diminished air movement. Cardiac: regular Rythm , without detected murmur.Port for Chemo at right upper chest.    Carotid Bruits Right Left  Negative Negative  Aorta is not palpable. Radial pulses: 2+ palpable and =   VASCULAR EXAM: Extremities without ischemic changes  without Gangrene; without open wounds.     LE Pulses Right Left   FEMORAL 2+ palpable 2+ palpable    POPLITEAL not palpable  not palpable   POSTERIOR TIBIAL 2+ palpable  1+ palpable    DORSALIS PEDIS  ANTERIOR  TIBIAL 2+ palpable  2+ palpable    Abdomen: soft, NT, no palpated masses. Skin: no rashes, no ulcers. Musculoskeletal: no muscle wasting or atrophy. Neurologic: A&O X 3; Appropriate Affect ; SENSATION: normal; MOTOR FUNCTION: moving all extremities equally, motor strength 5/5 throughout. Speech is fluent/normal. CN 2-12 intact.         Non-Invasive Vascular Imaging: DATE: 09/04/2014 ABI: RIGHT 1.3 (11/14/13, 1.27), Waveforms: triphasic;  LEFT 1.4 (1.18), Waveforms: triphasic   ASSESSMENT: Rick Davenport is a 54 y.o. 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 The patient was seen in the hospital after a colon resection. 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 He had minimal symptoms at that point. He continues to deny any claudication symptoms. He has no rest pain. He has no nonhealing wounds. Other medical problems include colon cancer with omental metastasis currently stable and followed by Dr. Benay Spice. His insurance did not approve ABI's until seen by a medical provider when he was here in February 2015,  he is here today for ABI's and discussion of results. ABI's today are normal with all triphasic waveforms, TBI's are normal. He walks about 2 miles daily. Has no PCP. He sees Dr. Blair Promise, oncologist, for chemo that has finished (colon cancer). He admits to smoking one cigarette daily.   PLAN:  Referral to [facility] or Internal Medicine for PCP.  Continue walking several miles daily.  The patient was counseled re smoking cessation and given several free resources re smoking cessation.  I discussed in depth with the patient the nature of atherosclerosis, and emphasized the importance of maximal medical management including strict control of blood pressure, blood glucose, and lipid levels, obtaining regular exercise, and cessation of smoking.  The patient is aware that without maximal medical  management the underlying atherosclerotic disease process will progress, limiting the benefit of any interventions.  Based on the patient's vascular studies and examination, pt will return to clinic in 1 year with ABI's.  The patient was given information about PAD including signs, symptoms, treatment, what symptoms should prompt the patient to seek immediate medical care, and risk reduction measures to take.  Clemon Chambers, RN, MSN, FNP-C [facility] Phone: [phone]  Clinic MD: Oneida Alar on call  09/04/2014 3:23 PM

## 2014-09-05 ENCOUNTER — Ambulatory Visit: Payer: Medicaid Other | Admitting: Nurse Practitioner

## 2014-09-05 LAB — CEA

## 2014-09-11 ENCOUNTER — Ambulatory Visit (HOSPITAL_BASED_OUTPATIENT_CLINIC_OR_DEPARTMENT_OTHER): Payer: Medicaid Other | Admitting: Oncology

## 2014-09-11 VITALS — BP 110/86 | HR 63 | Temp 97.8°F | Resp 18 | Ht 72.0 in | Wt 187.2 lb

## 2014-09-11 DIAGNOSIS — D509 Iron deficiency anemia, unspecified: Secondary | ICD-10-CM | POA: Diagnosis not present

## 2014-09-11 DIAGNOSIS — C772 Secondary and unspecified malignant neoplasm of intra-abdominal lymph nodes: Secondary | ICD-10-CM | POA: Diagnosis not present

## 2014-09-11 DIAGNOSIS — C189 Malignant neoplasm of colon, unspecified: Secondary | ICD-10-CM

## 2014-09-11 DIAGNOSIS — C180 Malignant neoplasm of cecum: Secondary | ICD-10-CM

## 2014-09-11 NOTE — Progress Notes (Signed)
  [place] OFFICE PROGRESS NOTE   Diagnosis: Colon cancer  INTERVAL HISTORY:   Mr. Rakestraw returns as scheduled. He feels well. No complaint. No difficulty with bowel function.  Objective:  Vital signs in last 24 hours:  Blood pressure 110/86, pulse 63, temperature 97.8 F (36.6 C), temperature source Oral, resp. rate 18, height 6' (1.829 m), weight 187 lb 3.2 oz (84.913 kg), SpO2 100 %.    HEENT: Neck without mass Lymphatics: No cervical, supra-clavicular, axillary, or inguinal nodes, soft mobile 1 cm cutaneous cyst in the right axilla Resp: Lungs clear bilaterally Cardio: Regular rate and rhythm GI: No hepatosplenomegaly, nontender, no mass Vascular: No leg edema   Portacath/PICC-without erythema  Lab Results:  Lab Results  Component Value Date   WBC 4.3 09/04/2014   HGB 13.4 09/04/2014   HCT 43.2 09/04/2014   MCV 80.9 09/04/2014   PLT 175 09/04/2014   NEUTROABS 2.2 09/04/2014     Lab Results  Component Value Date   CEA <0.5 09/04/2014    Imaging:  CTs of the chest, abdomen, and pelvis 09/04/2014-negative for recurrent colon cancer  Medications: I have reviewed the patient's current medications.  Assessment/Plan: 1. Clinical stage IV (T4, N2b,M1a) adenocarcinoma of the cecum, status post a right colectomy 10/04/2013.  No loss of mismatch repair protein expression, microsatellite stable   Omental nodule resected at the time of surgery 10/04/2013 with the pathology confirming a lymph node metastasis.   Chest CT 10/30/2013 with no definite signs of metastatic disease. 4 mm pulmonary nodule lateral segment of the right middle lobe.   Initiation of FOLFOX chemotherapy 11/18/2013.  Cycle 12 FOLFOX 04/28/2014.  Restaging CTs of the chest, abdomen, and pelvis on 09/04/2014-stable right middle lobe nodule, no evidence of recurrent disease 2. Microcytic anemia-likely iron deficiency anemia. Improved.  3. History of tobacco use. 4. 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 Oneida Alar. 5. History of Mild neutropenia 01/15/2014. Neulasta added beginning with cycle 5.   Disposition:  Rick Davenport remains in clinical remission from colon cancer. The plan is to keep the Port-A-Cath in place for now since he presented with stage IV disease. He has not undergone a colonoscopy. We will refer him for a colonoscopy. He will return for a Port-A-Cath flush in 5 weeks and an office visit/Port-A-Cath flush on 11/27/2014.  I referred him to internal medicine for primary care.  Betsy Coder, MD  09/11/2014  2:04 PM

## 2014-09-16 ENCOUNTER — Telehealth: Payer: Self-pay | Admitting: Nurse Practitioner

## 2014-09-16 NOTE — Telephone Encounter (Signed)
Pt confirmed labs/ov per 03/29 POF, gave pt AVS and Calendar.... KJ, mailed copy of PCP for pt to call to set up appointment.Marland KitchenMarland KitchenMarland Kitchen

## 2014-10-16 ENCOUNTER — Ambulatory Visit (HOSPITAL_BASED_OUTPATIENT_CLINIC_OR_DEPARTMENT_OTHER): Payer: Medicaid Other

## 2014-10-16 VITALS — BP 96/71 | HR 69 | Temp 98.2°F | Resp 18

## 2014-10-16 DIAGNOSIS — C180 Malignant neoplasm of cecum: Secondary | ICD-10-CM | POA: Diagnosis not present

## 2014-10-16 DIAGNOSIS — Z95828 Presence of other vascular implants and grafts: Secondary | ICD-10-CM

## 2014-10-16 DIAGNOSIS — Z452 Encounter for adjustment and management of vascular access device: Secondary | ICD-10-CM

## 2014-10-16 MED ORDER — HEPARIN SOD (PORK) LOCK FLUSH 100 UNIT/ML IV SOLN
500.0000 [IU] | Freq: Once | INTRAVENOUS | Status: AC
  Administered 2014-10-16: 500 [IU] via INTRAVENOUS
  Filled 2014-10-16: qty 5

## 2014-10-16 MED ORDER — SODIUM CHLORIDE 0.9 % IJ SOLN
10.0000 mL | INTRAMUSCULAR | Status: DC | PRN
  Administered 2014-10-16: 10 mL via INTRAVENOUS
  Filled 2014-10-16: qty 10

## 2014-10-16 NOTE — Patient Instructions (Signed)

## 2014-11-27 ENCOUNTER — Ambulatory Visit (HOSPITAL_BASED_OUTPATIENT_CLINIC_OR_DEPARTMENT_OTHER): Payer: Medicaid Other | Admitting: Nurse Practitioner

## 2014-11-27 ENCOUNTER — Ambulatory Visit (HOSPITAL_BASED_OUTPATIENT_CLINIC_OR_DEPARTMENT_OTHER): Payer: Medicaid Other

## 2014-11-27 ENCOUNTER — Telehealth: Payer: Self-pay | Admitting: Oncology

## 2014-11-27 VITALS — BP 125/88 | HR 89 | Temp 97.8°F | Resp 18 | Ht 72.0 in | Wt 185.6 lb

## 2014-11-27 DIAGNOSIS — C180 Malignant neoplasm of cecum: Secondary | ICD-10-CM | POA: Diagnosis not present

## 2014-11-27 DIAGNOSIS — C786 Secondary malignant neoplasm of retroperitoneum and peritoneum: Secondary | ICD-10-CM

## 2014-11-27 DIAGNOSIS — D509 Iron deficiency anemia, unspecified: Secondary | ICD-10-CM

## 2014-11-27 DIAGNOSIS — Z95828 Presence of other vascular implants and grafts: Secondary | ICD-10-CM

## 2014-11-27 DIAGNOSIS — C189 Malignant neoplasm of colon, unspecified: Secondary | ICD-10-CM

## 2014-11-27 DIAGNOSIS — C772 Secondary and unspecified malignant neoplasm of intra-abdominal lymph nodes: Secondary | ICD-10-CM | POA: Diagnosis not present

## 2014-11-27 MED ORDER — HEPARIN SOD (PORK) LOCK FLUSH 100 UNIT/ML IV SOLN
500.0000 [IU] | Freq: Once | INTRAVENOUS | Status: AC
  Administered 2014-11-27: 500 [IU] via INTRAVENOUS
  Filled 2014-11-27: qty 5

## 2014-11-27 MED ORDER — SODIUM CHLORIDE 0.9 % IJ SOLN
10.0000 mL | INTRAMUSCULAR | Status: DC | PRN
  Administered 2014-11-27: 10 mL via INTRAVENOUS
  Filled 2014-11-27: qty 10
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
## 2014-11-27 NOTE — Telephone Encounter (Signed)
Gave and printed appt sched and avs for pt for July and Sept

## 2014-11-27 NOTE — Patient Instructions (Signed)

## 2014-11-28 ENCOUNTER — Telehealth: Payer: Self-pay | Admitting: Oncology

## 2014-11-28 NOTE — Telephone Encounter (Signed)
Lft msg for pt confirming both referrals will be contacting pt with D/T's once they are schedule per 06/09 POF.... Cherylann Banas

## 2014-12-02 ENCOUNTER — Telehealth: Payer: Self-pay | Admitting: Oncology

## 2014-12-02 NOTE — Telephone Encounter (Signed)
Faxed pt medical records to Dr Benson Norway

## 2014-12-08 ENCOUNTER — Telehealth: Payer: Self-pay | Admitting: *Deleted

## 2014-12-08 NOTE — Telephone Encounter (Signed)
Received call from [place] stating Dr. Ronalee Red is unable to see this patient this week and will try to see him as soon as possible. Wanted MD to be aware of delay.

## 2015-01-08 ENCOUNTER — Ambulatory Visit (HOSPITAL_BASED_OUTPATIENT_CLINIC_OR_DEPARTMENT_OTHER): Payer: Medicaid Other

## 2015-01-08 VITALS — BP 122/80 | HR 73 | Temp 97.8°F | Resp 16

## 2015-01-08 DIAGNOSIS — C180 Malignant neoplasm of cecum: Secondary | ICD-10-CM

## 2015-01-08 DIAGNOSIS — Z452 Encounter for adjustment and management of vascular access device: Secondary | ICD-10-CM

## 2015-01-08 DIAGNOSIS — Z95828 Presence of other vascular implants and grafts: Secondary | ICD-10-CM

## 2015-01-08 MED ORDER — HEPARIN SOD (PORK) LOCK FLUSH 100 UNIT/ML IV SOLN
500.0000 [IU] | Freq: Once | INTRAVENOUS | Status: AC
  Administered 2015-01-08: 500 [IU] via INTRAVENOUS
  Filled 2015-01-08: qty 5

## 2015-01-08 MED ORDER — SODIUM CHLORIDE 0.9 % IJ SOLN
10.0000 mL | INTRAMUSCULAR | Status: DC | PRN
  Administered 2015-01-08: 10 mL via INTRAVENOUS
  Filled 2015-01-08: qty 10

## 2015-01-08 NOTE — Patient Instructions (Signed)

## 2015-02-19 ENCOUNTER — Ambulatory Visit (HOSPITAL_BASED_OUTPATIENT_CLINIC_OR_DEPARTMENT_OTHER): Payer: Medicaid Other | Admitting: Oncology

## 2015-02-19 ENCOUNTER — Ambulatory Visit (HOSPITAL_BASED_OUTPATIENT_CLINIC_OR_DEPARTMENT_OTHER): Payer: Medicaid Other

## 2015-02-19 ENCOUNTER — Telehealth: Payer: Self-pay | Admitting: Oncology

## 2015-02-19 VITALS — BP 117/88 | HR 67 | Temp 98.0°F | Resp 18 | Ht 72.0 in | Wt 186.1 lb

## 2015-02-19 DIAGNOSIS — D509 Iron deficiency anemia, unspecified: Secondary | ICD-10-CM | POA: Diagnosis not present

## 2015-02-19 DIAGNOSIS — Z95828 Presence of other vascular implants and grafts: Secondary | ICD-10-CM

## 2015-02-19 DIAGNOSIS — Z23 Encounter for immunization: Secondary | ICD-10-CM

## 2015-02-19 DIAGNOSIS — Z85038 Personal history of other malignant neoplasm of large intestine: Secondary | ICD-10-CM | POA: Diagnosis not present

## 2015-02-19 DIAGNOSIS — C189 Malignant neoplasm of colon, unspecified: Secondary | ICD-10-CM

## 2015-02-19 MED ORDER — INFLUENZA VAC SPLIT QUAD 0.5 ML IM SUSY
0.5000 mL | PREFILLED_SYRINGE | INTRAMUSCULAR | Status: AC
  Administered 2015-02-19: 0.5 mL via INTRAMUSCULAR
  Filled 2015-02-19: qty 0.5

## 2015-02-19 MED ORDER — SODIUM CHLORIDE 0.9 % IJ SOLN
10.0000 mL | INTRAMUSCULAR | Status: DC | PRN
  Administered 2015-02-19: 10 mL via INTRAVENOUS
  Filled 2015-02-19: qty 10

## 2015-02-19 MED ORDER — HEPARIN SOD (PORK) LOCK FLUSH 100 UNIT/ML IV SOLN
500.0000 [IU] | Freq: Once | INTRAVENOUS | Status: AC
  Administered 2015-02-19: 500 [IU] via INTRAVENOUS
  Filled 2015-02-19: qty 5

## 2015-02-19 NOTE — Patient Instructions (Signed)

## 2015-02-19 NOTE — Progress Notes (Signed)
  [facility] OFFICE PROGRESS NOTE   Diagnosis: Colon cancer  INTERVAL HISTORY:   Rick Davenport returns as scheduled pretty feels well. Good appetite and energy level. No complaint. He continues smoking.  Objective:  Vital signs in last 24 hours:  Blood pressure 117/88, pulse 67, temperature 98 F (36.7 C), temperature source Oral, resp. rate 18, height 6' (1.829 m), weight 186 lb 1.6 oz (84.414 kg), SpO2 100 %.    HEENT: Neck without mass Lymphatics: No cervical, supra-clavicular, axillary, or inguinal nodes. Mobile oval 1 cm cutaneous cyst in the right axilla Resp: Bronchial sounds bilaterally, no respiratory distress Cardio: Regular rate and rhythm GI: No hepatosplenomegaly, nontender, no mass Vascular: No leg edema   Portacath/PICC-without erythema  Lab Results:   Lab Results  Component Value Date   CEA <0.5 09/04/2014    Medications: I have reviewed the patient's current medications.  Assessment/Plan: 1. Clinical stage IV (T4, N2b,M1a) adenocarcinoma of the cecum, status post a right colectomy 10/04/2013.  No loss of mismatch repair protein expression, microsatellite stable   Omental nodule resected at the time of surgery 10/04/2013 with the pathology confirming a lymph node metastasis.   Chest CT 10/30/2013 with no definite signs of metastatic disease. 4 mm pulmonary nodule lateral segment of the right middle lobe.   Initiation of FOLFOX chemotherapy 11/18/2013.  Cycle 12 FOLFOX 04/28/2014.  Restaging CTs of the chest, abdomen, and pelvis on 09/04/2014-stable right middle lobe nodule, no evidence of recurrent disease 2. Microcytic anemia-likely iron deficiency anemia. Improved.  3. History of tobacco use. 4. 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 Oneida Alar. 5. History of mild neutropenia 01/15/2014. Neulasta added beginning with cycle 5.     Disposition:  Rick Davenport remains in clinical remission  from colon cancer. We will check a CEA when he returns for a Port-A-Cath flush in 6 weeks. He will return for an office visit and Port-A-Cath flush in 3 months. I recommended he schedule a surveillance colonoscopy with Dr. Benson Norway. Rick Davenport received an influenza vaccine today. We will administer a pneumonia vaccine when he returns in 6 weeks.  Betsy Coder, MD  02/19/2015  10:42 AM

## 2015-02-19 NOTE — Telephone Encounter (Signed)
per pof to sch pt appt-gave pt copy of avs °

## 2015-04-01 ENCOUNTER — Other Ambulatory Visit: Payer: Self-pay | Admitting: *Deleted

## 2015-04-01 DIAGNOSIS — Z23 Encounter for immunization: Secondary | ICD-10-CM

## 2015-04-01 DIAGNOSIS — C189 Malignant neoplasm of colon, unspecified: Secondary | ICD-10-CM

## 2015-04-02 ENCOUNTER — Other Ambulatory Visit: Payer: Medicaid Other

## 2015-04-02 ENCOUNTER — Ambulatory Visit: Payer: Medicaid Other

## 2015-04-02 ENCOUNTER — Ambulatory Visit (HOSPITAL_BASED_OUTPATIENT_CLINIC_OR_DEPARTMENT_OTHER): Payer: Medicaid Other

## 2015-04-02 VITALS — BP 97/67 | HR 78 | Temp 98.2°F | Resp 18

## 2015-04-02 DIAGNOSIS — Z23 Encounter for immunization: Secondary | ICD-10-CM

## 2015-04-02 DIAGNOSIS — Z95828 Presence of other vascular implants and grafts: Secondary | ICD-10-CM

## 2015-04-02 DIAGNOSIS — Z85038 Personal history of other malignant neoplasm of large intestine: Secondary | ICD-10-CM | POA: Diagnosis not present

## 2015-04-02 DIAGNOSIS — C189 Malignant neoplasm of colon, unspecified: Secondary | ICD-10-CM

## 2015-04-02 LAB — CEA

## 2015-04-02 MED ORDER — PNEUMOCOCCAL 13-VAL CONJ VACC IM SUSP
0.5000 mL | INTRAMUSCULAR | Status: AC
  Administered 2015-04-02: 0.5 mL via INTRAMUSCULAR
  Filled 2015-04-02: qty 0.5

## 2015-04-02 MED ORDER — SODIUM CHLORIDE 0.9 % IJ SOLN
10.0000 mL | INTRAMUSCULAR | Status: DC | PRN
  Administered 2015-04-02: 10 mL via INTRAVENOUS
  Filled 2015-04-02: qty 10

## 2015-04-02 MED ORDER — HEPARIN SOD (PORK) LOCK FLUSH 100 UNIT/ML IV SOLN
500.0000 [IU] | Freq: Once | INTRAVENOUS | Status: AC
  Administered 2015-04-02: 500 [IU] via INTRAVENOUS
  Filled 2015-04-02: qty 5

## 2015-04-02 NOTE — Patient Instructions (Signed)

## 2015-05-12 ENCOUNTER — Telehealth: Payer: Self-pay | Admitting: Oncology

## 2015-05-12 NOTE — Telephone Encounter (Signed)
Due to LT out per BS patient will keep lab/flush and reschedule f/u for 6 weeks. Spoke with patient he is aware and has appointments for 11/28 and 1/9.

## 2015-05-18 ENCOUNTER — Ambulatory Visit: Payer: Medicaid Other | Admitting: Nurse Practitioner

## 2015-05-18 ENCOUNTER — Ambulatory Visit (HOSPITAL_BASED_OUTPATIENT_CLINIC_OR_DEPARTMENT_OTHER): Payer: Medicaid Other

## 2015-05-18 ENCOUNTER — Other Ambulatory Visit: Payer: Medicaid Other

## 2015-05-18 VITALS — BP 104/75 | HR 60 | Temp 97.7°F | Resp 14

## 2015-05-18 DIAGNOSIS — C189 Malignant neoplasm of colon, unspecified: Secondary | ICD-10-CM

## 2015-05-18 DIAGNOSIS — C180 Malignant neoplasm of cecum: Secondary | ICD-10-CM

## 2015-05-18 DIAGNOSIS — Z23 Encounter for immunization: Secondary | ICD-10-CM | POA: Diagnosis not present

## 2015-05-18 DIAGNOSIS — C786 Secondary malignant neoplasm of retroperitoneum and peritoneum: Secondary | ICD-10-CM

## 2015-05-18 MED ORDER — HEPARIN SOD (PORK) LOCK FLUSH 100 UNIT/ML IV SOLN
500.0000 [IU] | Freq: Once | INTRAVENOUS | Status: AC
  Administered 2015-05-18: 500 [IU] via INTRAVENOUS
  Filled 2015-05-18: qty 5

## 2015-05-18 MED ORDER — PNEUMOCOCCAL 13-VAL CONJ VACC IM SUSP
0.5000 mL | Freq: Once | INTRAMUSCULAR | Status: AC
  Administered 2015-05-18: 0.5 mL via INTRAMUSCULAR
  Filled 2015-05-18: qty 0.5

## 2015-05-18 MED ORDER — SODIUM CHLORIDE 0.9 % IJ SOLN
10.0000 mL | INTRAMUSCULAR | Status: DC | PRN
  Administered 2015-05-18: 10 mL via INTRAVENOUS
  Filled 2015-05-18: qty 10

## 2015-05-18 NOTE — Patient Instructions (Signed)

## 2015-05-19 LAB — CEA: CEA: <0.5 ng/mL (ref 0.0–5.0)

## 2015-06-29 ENCOUNTER — Ambulatory Visit: Payer: Medicaid Other | Admitting: Oncology

## 2015-09-02 ENCOUNTER — Encounter: Payer: Self-pay | Admitting: Family

## 2015-09-10 ENCOUNTER — Encounter: Payer: Self-pay | Admitting: Family

## 2015-09-10 ENCOUNTER — Ambulatory Visit (INDEPENDENT_AMBULATORY_CARE_PROVIDER_SITE_OTHER): Payer: Medicaid Other | Admitting: Family

## 2015-09-10 ENCOUNTER — Ambulatory Visit (HOSPITAL_COMMUNITY)
Admission: RE | Admit: 2015-09-10 | Discharge: 2015-09-10 | Disposition: A | Discharge status: Home / Self Care | Payer: Medicaid Other | Source: Ambulatory Visit | Attending: Family | Admitting: Family

## 2015-09-10 VITALS — BP 115/77 | HR 84 | Temp 96.8°F | Resp 16 | Ht 72.0 in | Wt 203.0 lb

## 2015-09-10 DIAGNOSIS — I739 Peripheral vascular disease, unspecified: Secondary | ICD-10-CM

## 2015-09-10 DIAGNOSIS — I771 Stricture of artery: Secondary | ICD-10-CM | POA: Diagnosis not present

## 2015-09-10 DIAGNOSIS — Z87898 Personal history of other specified conditions: Secondary | ICD-10-CM | POA: Diagnosis not present

## 2015-09-10 DIAGNOSIS — Z87891 Personal history of nicotine dependence: Secondary | ICD-10-CM | POA: Diagnosis not present

## 2015-09-10 NOTE — Progress Notes (Signed)
[facility] [place] HISTORY AND PHYSICAL -PAD  History of Present Illness Rick Davenport is a 55 y.o. male patient of Dr. 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 The patient was seen in the hospital after a colon resection. 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 He had minimal symptoms at that point. He continues to deny any claudication symptoms. He has no rest pain. He has no nonhealing wounds. Other medical problems include colon cancer with omental metastasis currently stable and followed by Dr. Benay Spice.  Pt has not had previous PAD intervention. Pt denies any history of stroke or TIA, denies any cardiac problems. Pt states that he walks about an hour daily.  The patient denies New Medical or Surgical History. Has no PCP.  Pt Diabetic: No Pt smoker: former smoker, quit in January 2017 Pt states he rarely uses ETOH  Pt meds include: Statin :No Betablocker: No ASA: not taking daily as advised Other anticoagulants/antiplatelets: no     Past Medical History  Diagnosis Date  . Cancer ([place]) 09/2013    colon ca  . Peripheral vascular disease [facility])     Social History Social History  Substance Use Topics  . Smoking status: Former Smoker -- 1.00 packs/day for 40 years    Types: Cigarettes    Quit date: 07/13/2015  . Smokeless tobacco: Never Used     Comment: pt states that he is trying to quit smoking  . Alcohol Use: 0.0 oz/week    0 Standard drinks or equivalent per week     Comment: 1 can of beer per day    Family History Family History  Problem Relation Age of Onset  . Diabetes Mother   . Lung cancer Father   . Prostate cancer Brother     Oldest brother    Past Surgical History  Procedure Laterality Date  . Partial colectomy Right 10/04/2013    Procedure: RIGHT HEMICOLECTOMY ;  Surgeon: Imogene Burn. Georgette Dover, MD;  Location: [place];  Service: General;  Laterality: Right;     No Known Allergies  No current outpatient prescriptions on file.   No current facility-administered medications for this visit.    ROS: See HPI for pertinent positives and negatives.   Physical Examination  Filed Vitals:   09/10/15 1309  BP: 115/77  Pulse: 84  Temp: 96.8 F (36 C)  Resp: 16  Height: 6' (1.829 m)  Weight: 203 lb (92.08 kg)  SpO2: 99%   Body mass index is 27.53 kg/(m^2).  General: A&O x 3, WDWN. Gait: normal Eyes: PERRLA. Pulmonary: CTAB, respirations are non labored, no wheezes , rales or rhonchi, good air movement. Cardiac: regular Rhythm, no detected murmur.Port for Chemo at right upper chest.    Carotid Bruits Right Left   Negative Negative  Aorta is not palpable. Radial pulses: 2+ palpable and =   VASCULAR EXAM: Extremities without ischemic changes  without Gangrene; without open wounds.     LE Pulses Right Left   FEMORAL 3+ palpable 3+ palpable    POPLITEAL not palpable  1+ palpable   POSTERIOR TIBIAL 2+ palpable  1+ palpable    DORSALIS PEDIS  ANTERIOR TIBIAL 2+ palpable  2+ palpable    Abdomen: soft, NT, no palpated masses. Skin: no rashes, no ulcers. Musculoskeletal: no muscle wasting or atrophy. Neurologic: A&O X 3; Appropriate Affect ; SENSATION: normal; MOTOR FUNCTION: moving all extremities equally, motor strength 5/5 throughout. Speech is  fluent/normal. CN 2-12 intact.                  Non-Invasive Vascular Imaging: DATE: 09/10/2015 ABI: RIGHT: 1.23 (1.3, 09/04/14, Waveforms: triphasic;  LEFT: 1.19 (1.4), Waveforms: triphasic   ASSESSMENT: Rick Davenport is a 55 y.o. 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 The  patient was seen in the hospital after a colon resection. 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 He had minimal symptoms at that point. He continues to deny any claudication symptoms. He has no rest pain. He has no nonhealing wounds. Other medical problems include colon cancer with omental metastasis currently stable and followed by Dr. Benay Spice. His insurance did not approve ABI's until seen by a medical provider when he was here in February 2015, he is here today for ABI's and discussion of results. ABI's today are normal with all triphasic waveforms, TBI's are normal. He walks about 2 miles daily. Has no PCP, I referred him to [facility] or internal medicine but his insurance was not accepted; will try referral to [facility] clinic. He sees Dr. Blair Promise, oncologist, for chemo that has finished (colon cancer). He stopped smoking two months ago and was congratulated.  ABI's remain normal with all triphasic waveforms.   PLAN:  Referral to [facility] clinic as prior referral to PCP did not accept his insurance.  Based on the patient's vascular studies and examination, pt will return to clinic in 1 year with ABI's.    I discussed in depth with the patient the nature of atherosclerosis, and emphasized the importance of maximal medical management including strict control of blood pressure, blood glucose, and lipid levels, obtaining regular exercise, and continued cessation of smoking.  The patient is aware that without maximal medical management the underlying atherosclerotic disease process will progress, limiting the benefit of any interventions.  The patient was given information about PAD including signs, symptoms, treatment, what symptoms should prompt the patient to seek immediate medical care, and risk reduction measures to take.  Clemon Chambers, RN, MSN, FNP-C [facility] Phone: [phone]  Clinic MD: Scot Dock  on call  09/10/2015 1:12 PM

## 2015-09-11 NOTE — Addendum Note (Signed)
Addended by: Mena Goes on: 09/11/2015 06:03 PM   Modules accepted: Orders

## 2015-10-26 IMAGING — CT CT CHEST W/ CM
1 of 3 series · 15 of 31 positions shown, 19 images · IV contrast (OMNIPAQUE)
Comparison: CT 10/30/2013

CLINICAL DATA: Colon cancer diagnosed September 2013. Chemotherapy
complete.

EXAM:
CT CHEST, ABDOMEN, AND PELVIS WITH CONTRAST
TECHNIQUE: Multidetector CT imaging of the chest, abdomen and pelvis was
performed following the standard protocol during bolus
administration of intravenous contrast.
CONTRAST:  100mL OMNIPAQUE IOHEXOL 300 MG/ML  SOLN

[Series 2: cap with st · axial · 0.69mm/px · z∈[-637,-67]mm · 15 of 132 slices shown, 19 images]
[im 9/132  mediastinal]
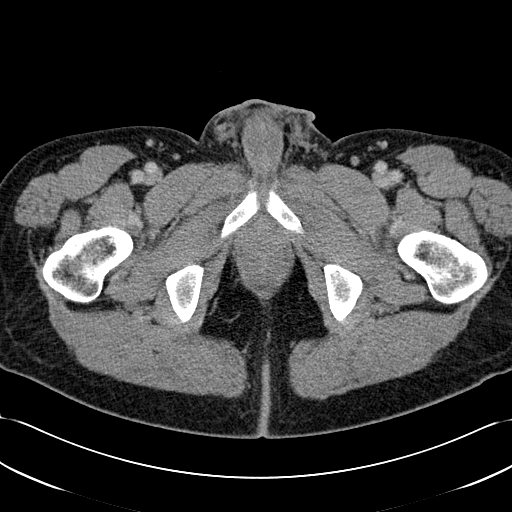
[im 9/132  lung]
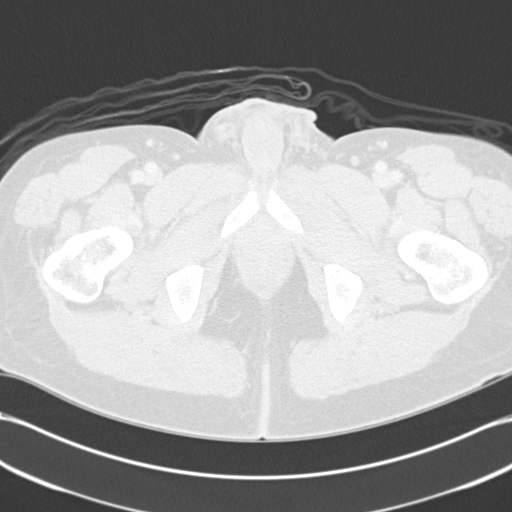
[im 18/132  lung]
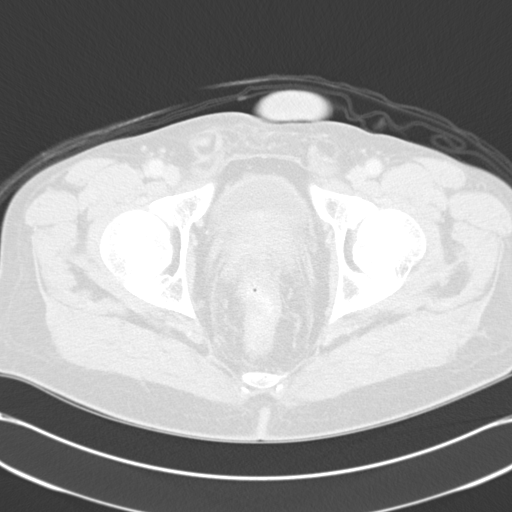
[im 27/132  lung]
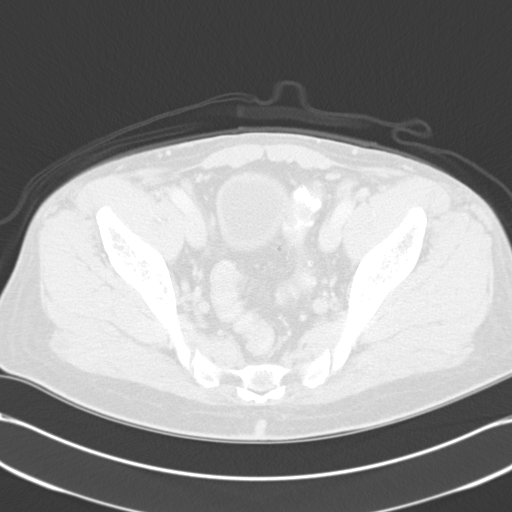
[im 35/132  lung]
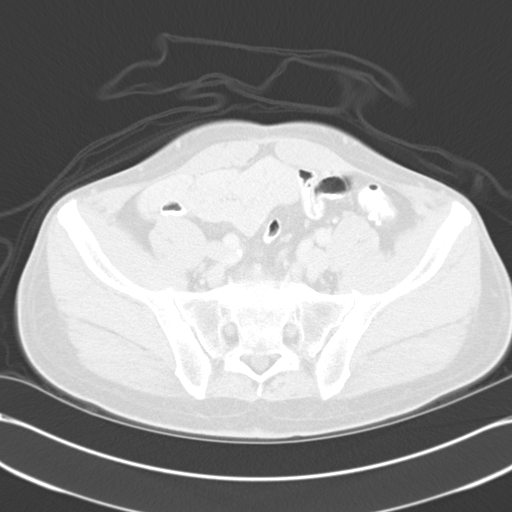
[im 44/132  mediastinal]
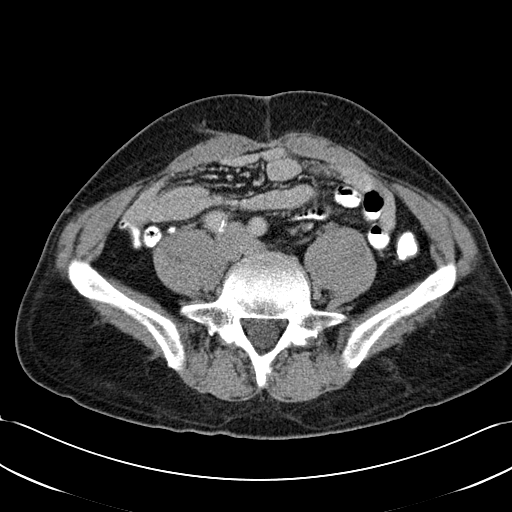
[im 44/132  lung]
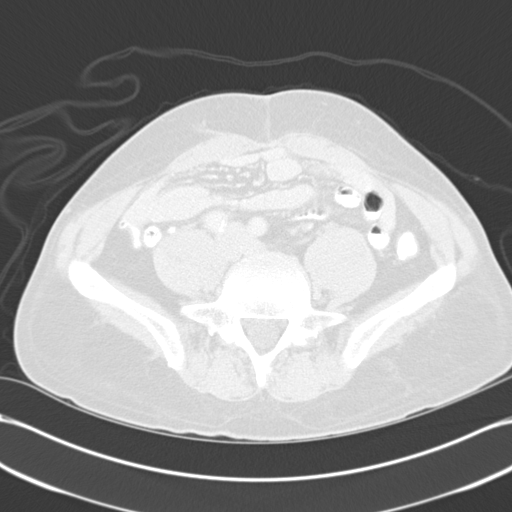
[im 53/132  lung]
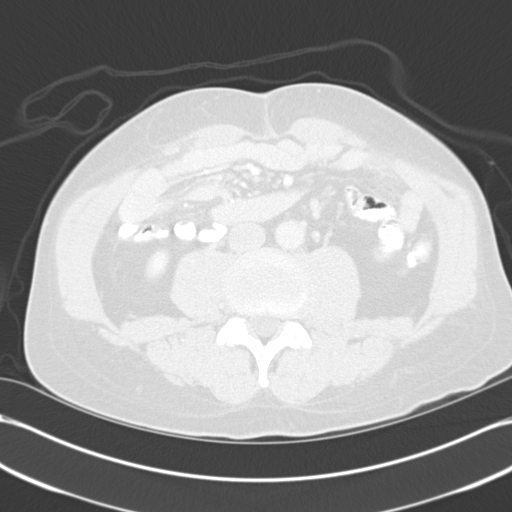
[im 62/132  lung]
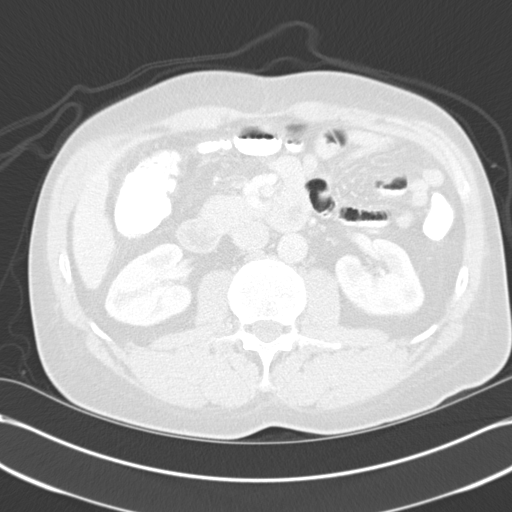
[im 66/132  lung]
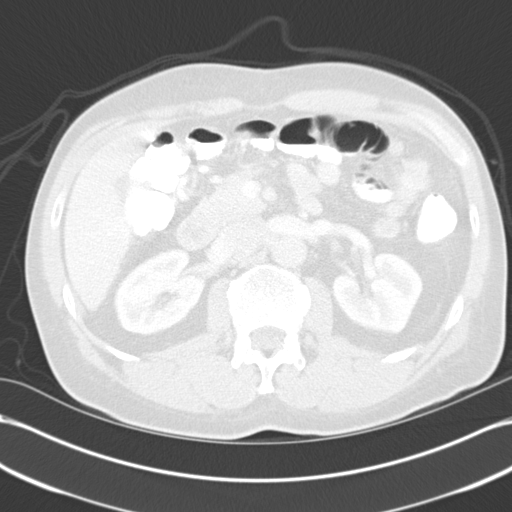
[im 70/132  mediastinal]
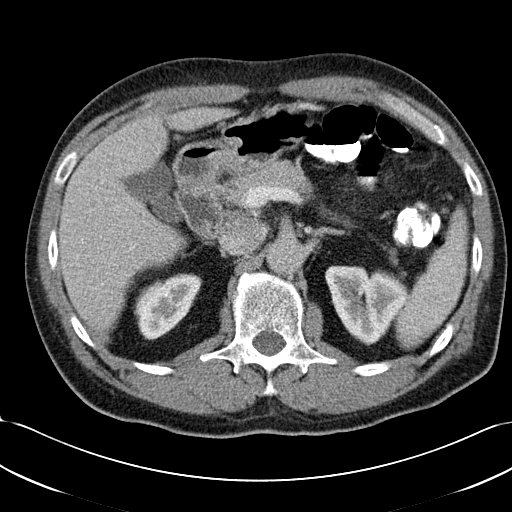
[im 70/132  lung]
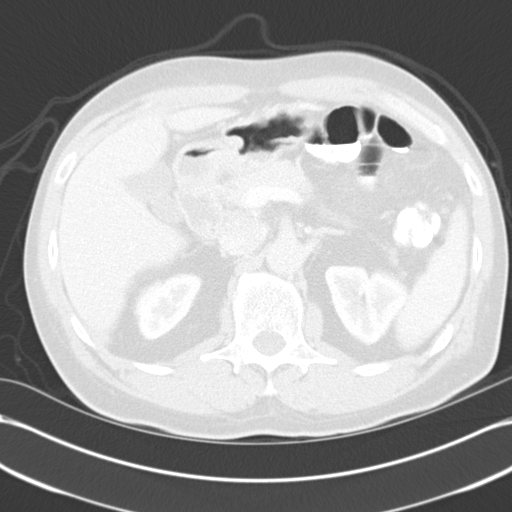
[im 79/132  lung]
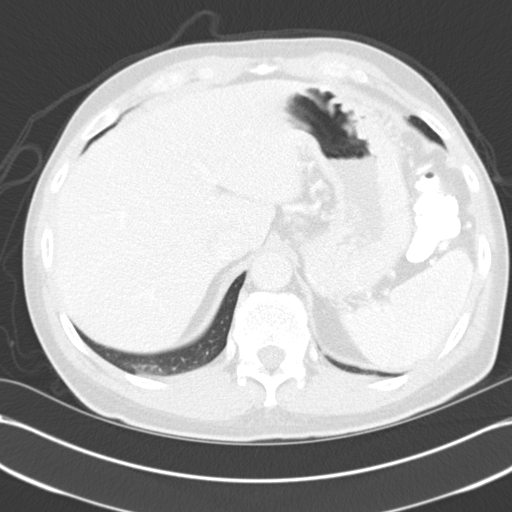
[im 88/132  lung]
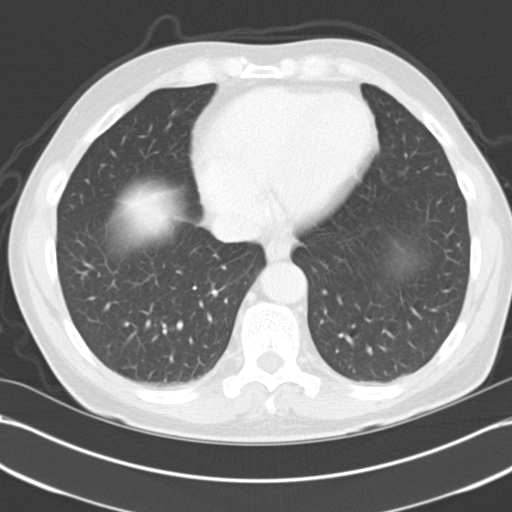
[im 97/132  lung]
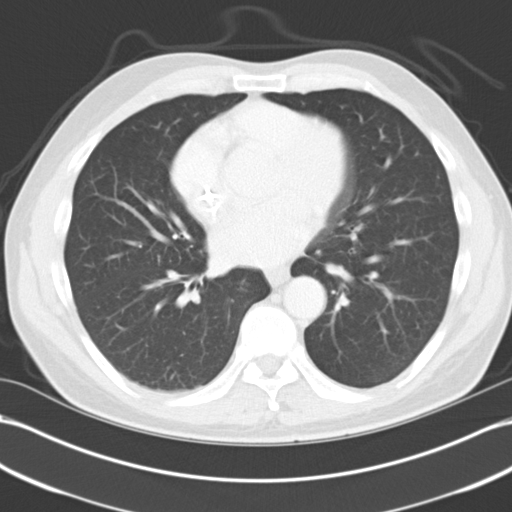
[im 105/132  mediastinal]
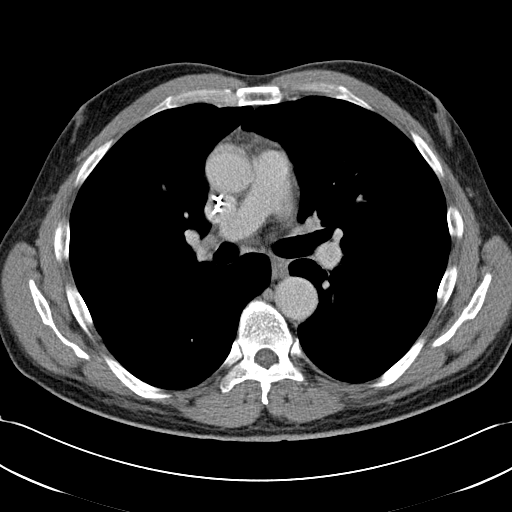
[im 105/132  lung]
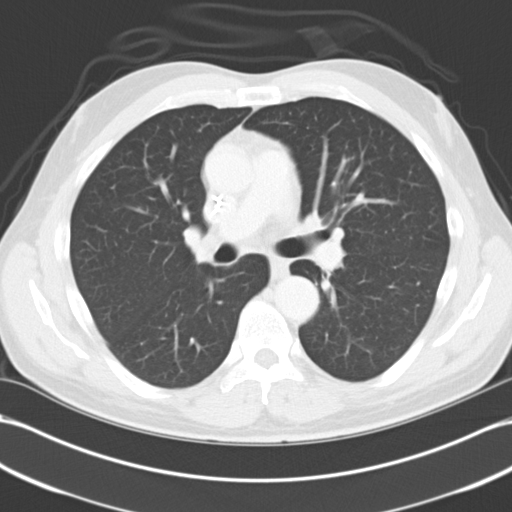
[im 114/132  lung]
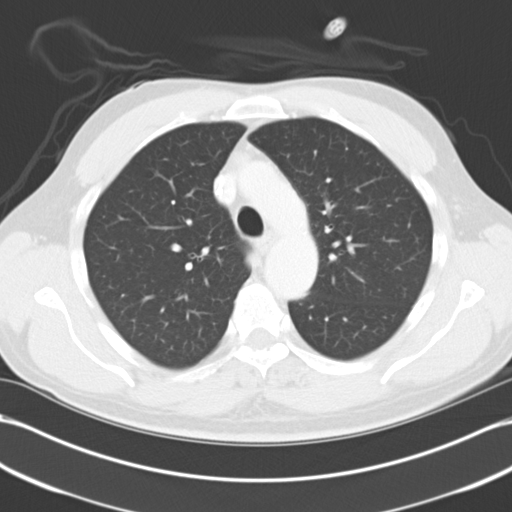
[im 123/132  lung]
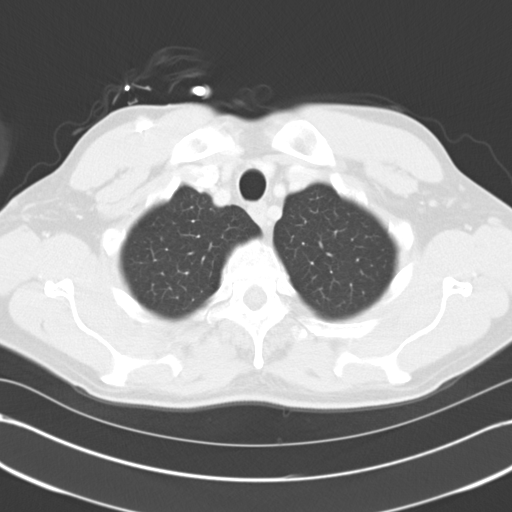

[15 of 31 positions shown; findings below may reference images not displayed]

FINDINGS: CT CHEST FINDINGS

No axillary supraclavicular lymphadenopathy. There is a port in left
anterior chest wall. No mediastinal hilar lymphadenopathy. No
pericardial fluid.

No 3 mm nodule in the right middle lobe is not changed from prior.
(Image 30, series 4

CT ABDOMEN AND PELVIS FINDINGS

Hepatobiliary: No focal hepatic lesion. No biliary duct dilatation.
Normal gallbladder.

Pancreas: Pancreas is normal. No ductal dilatation. No pancreatic
inflammation.

Spleen: Normal spleen.

Adrenals/Urinary Tract: Adrenal glands and kidneys are normal. The
ureters and bladder normal.

Stomach/Bowel: Stomach, small bowel, cecum normal. Partial right
colectomy. Transverse colon is normal. The sigmoid colon is
collapsed. The rectum is collapsed. No obstruction or nodularity.

Vascular/Lymphatic: Abdominal aorta is normal caliber. There is no
retroperitoneal or periportal lymphadenopathy. No pelvic
lymphadenopathy.

Reproductive: Normal prostate gland.  No pelvic lymphadenopathy.

Other: No aggressive osseous lesion.

Musculoskeletal: No aggressive osseous lesion.
IMPRESSION: 1. No evidence of thoracic metastasis. Stable right middle lobe
pulmonary nodule.
2. No evidence of local colon cancer recurrence or metastasis in the
abdomen pelvis.

## 2016-05-18 ENCOUNTER — Telehealth: Payer: Self-pay | Admitting: *Deleted

## 2016-05-18 NOTE — Telephone Encounter (Signed)
Message from pt requesting to reschedule missed office visit. Returned call to pt, informed him of office/flush appointment on 05/25/16.

## 2016-05-25 ENCOUNTER — Ambulatory Visit: Payer: Medicaid Other | Admitting: Nurse Practitioner

## 2016-08-26 ENCOUNTER — Encounter: Payer: Self-pay | Admitting: Family

## 2016-09-08 ENCOUNTER — Encounter: Payer: Self-pay | Admitting: Family

## 2016-09-08 ENCOUNTER — Ambulatory Visit (INDEPENDENT_AMBULATORY_CARE_PROVIDER_SITE_OTHER): Payer: Medicare Other | Admitting: Family

## 2016-09-08 ENCOUNTER — Ambulatory Visit (HOSPITAL_COMMUNITY)
Admission: RE | Admit: 2016-09-08 | Discharge: 2016-09-08 | Disposition: A | Discharge status: Home / Self Care | Payer: Medicare Other | Source: Ambulatory Visit | Attending: Family | Admitting: Family

## 2016-09-08 VITALS — BP 126/89 | HR 73 | Temp 97.6°F | Resp 20 | Ht 72.0 in | Wt 204.0 lb

## 2016-09-08 DIAGNOSIS — I771 Stricture of artery: Secondary | ICD-10-CM | POA: Diagnosis not present

## 2016-09-08 DIAGNOSIS — I739 Peripheral vascular disease, unspecified: Secondary | ICD-10-CM | POA: Diagnosis not present

## 2016-09-08 DIAGNOSIS — F17200 Nicotine dependence, unspecified, uncomplicated: Secondary | ICD-10-CM

## 2016-09-08 DIAGNOSIS — Z87891 Personal history of nicotine dependence: Secondary | ICD-10-CM | POA: Insufficient documentation

## 2016-09-08 DIAGNOSIS — R0989 Other specified symptoms and signs involving the circulatory and respiratory systems: Secondary | ICD-10-CM | POA: Diagnosis present

## 2016-09-08 NOTE — Progress Notes (Signed)
[facility] [place]   CC: Follow up peripheral artery occlusive disease  History of Present Illness Nishan Ovens is a 56 y.o. male patient of Dr. 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 The patient was seen in the hospital after a colon resection. 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 He had minimal symptoms at that point. He continues to deny any claudication symptoms. He has no rest pain. He has no nonhealing wounds. Other medical problems include colon cancer with omental metastasis currently stable and followed by Dr. Benay Spice.  Pt has not had previous PAD intervention. Pt denies any history of stroke or TIA, denies any cardiac problems. Pt states that he walks about an hour or more daily.  He denies New Medical or Surgical History. His PCP seems to be [facility].  Pt Diabetic: No Pt smoker: current smoker Pt states he rarely uses ETOH  Pt meds include: Statin :No Betablocker: No ASA: not taking daily as advised; states he will start taking Other anticoagulants/antiplatelets: no      Past Medical History:  Diagnosis Date  . Cancer ([place]) 09/2013   colon ca  . Peripheral vascular disease [facility])     Social History Social History  Substance Use Topics  . Smoking status: Former Smoker    Packs/day: 1.00    Years: 40.00    Types: Cigarettes    Quit date: 07/13/2015  . Smokeless tobacco: Never Used     Comment: pt states that he is trying to quit smoking  . Alcohol use 0.0 oz/week     Comment: 1 can of beer per day    Family History Family History  Problem Relation Age of Onset  . Diabetes Mother   . Lung cancer Father   . Prostate cancer Brother     Oldest brother    Past Surgical History:  Procedure Laterality Date  . PARTIAL COLECTOMY Right 10/04/2013   Procedure: RIGHT HEMICOLECTOMY ;  Surgeon: Imogene Burn. Georgette Dover, MD;  Location: [place];   Service: General;  Laterality: Right;    No Known Allergies  No current outpatient prescriptions on file.   No current facility-administered medications for this visit.     ROS: See HPI for pertinent positives and negatives.   Physical Examination  Vitals:   09/08/16 1130  BP: 126/89  Pulse: 73  Resp: 20  Temp: 97.6 F (36.4 C)  TempSrc: Oral  SpO2: 98%  Weight: 204 lb (92.5 kg)  Height: 6' (1.829 m)   Body mass index is 27.67 kg/m.  General: A&O x 3, WDWN. Gait: normal Eyes: PERRLA. Pulmonary: CTAB, respirations are non labored, no wheezes, rales, or rhonchi, good air movement. Cardiac: regular rhythm, no detected murmur.    Carotid Bruits Right Left   Negative Negative  Aorta is not palpable. Radial pulses: 2+ palpable and =   VASCULAR EXAM: Extremities without ischemic changes  without Gangrene; without open wounds.     LE Pulses Right Left   FEMORAL 2+ palpable 3+ palpable    POPLITEAL not palpable  1+ palpable   POSTERIOR TIBIAL 2+ palpable  1+ palpable    DORSALIS PEDIS  ANTERIOR TIBIAL 2+ palpable  2+ palpable    Abdomen: soft, NT, no palpated masses. Skin: no rashes, no ulcers. Musculoskeletal: no muscle wasting or atrophy. Neurologic: A&O X 3; Appropriate Affect ; SENSATION: normal; MOTOR FUNCTION: moving all extremities equally, motor strength 5/5  throughout. Speech is fluent/normal. CN 2-12 intact.    ASSESSMENT: Esequiel Kleinfelter is a 57 y.o. male whopresents for follow up evaluation of right external iliac artery stenosis. The patient was seen in the hospital after a colon resection. 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 He had  minimal symptoms at that point. He continues to deny any claudication symptoms. He has no rest pain. He has no nonhealing wounds. Other medical problems include colon cancer with omental metastasis currently stable and followed by Dr. Benay Spice.  He walks about 2 miles daily.  He sees Dr. Blair Promise, oncologist, for chemo that has finished (colon cancer). He resumed smoking and was counseled re smoking cessation and given several free resources re smoking cessation.  ABI's remain normal with all triphasic waveforms.    PLAN:  Based on the patient's vascular studies and examination, pt will return to clinic in 1 year with ABI's. I advised him to returns sooner if he develops concerns re the circulation in his legs.   I discussed in depth with the patient the nature of atherosclerosis, and emphasized the importance of maximal medical management including strict control of blood pressure, blood glucose, and lipid levels, obtaining regular exercise, and cessation of smoking.  The patient is aware that without maximal medical management the underlying atherosclerotic disease process will progress, limiting the benefit of any interventions.  The patient was given information about PAD including signs, symptoms, treatment, what symptoms should prompt the patient to seek immediate medical care, and risk reduction measures to take.  Clemon Chambers, RN, MSN, FNP-C [facility] [employer] [facility] Phone: [phone]  Clinic MD: [facility]  09/08/16 11:43 AM

## 2016-09-08 NOTE — Patient Instructions (Addendum)
Peripheral Vascular Disease Peripheral vascular disease (PVD) is a disease of the blood vessels that are not part of your heart and brain. A simple term for PVD is poor circulation. In most cases, PVD narrows the blood vessels that carry blood from your heart to the rest of your body. This can result in a decreased supply of blood to your arms, legs, and internal organs, like your stomach or kidneys. However, it most often affects a person's lower legs and feet. There are two types of PVD.  Organic PVD. This is the more common type. It is caused by damage to the structure of blood vessels.  Functional PVD. This is caused by conditions that make blood vessels contract and tighten (spasm). Without treatment, PVD tends to get worse over time. PVD can also lead to acute ischemic limb. This is when an arm or limb suddenly has trouble getting enough blood. This is a medical emergency. Follow these instructions at home:  Take medicines only as told by your doctor.  Do not use any tobacco products, including cigarettes, chewing tobacco, or electronic cigarettes. If you need help quitting, ask your doctor.  Lose weight if you are overweight, and maintain a healthy weight as told by your doctor.  Eat a diet that is low in fat and cholesterol. If you need help, ask your doctor.  Exercise regularly. Ask your doctor for some good activities for you.  Take good care of your feet.  Wear comfortable shoes that fit well.  Check your feet often for any cuts or sores. Contact a doctor if:  You have cramps in your legs while walking.  You have leg pain when you are at rest.  You have coldness in a leg or foot.  Your skin changes.  You are unable to get or have an erection (erectile dysfunction).  You have cuts or sores on your feet that are not healing. Get help right away if:  Your arm or leg turns cold and blue.  Your arms or legs become red, warm, swollen, painful, or numb.  You have  chest pain or trouble breathing.  You suddenly have weakness in your face, arm, or leg.  You become very confused or you cannot speak.  You suddenly have a very bad headache.  You suddenly cannot see. This information is not intended to replace advice given to you by your health care provider. Make sure you discuss any questions you have with your health care provider. Document Released: 08/31/2009 Document Revised: 11/12/2015 Document Reviewed: 11/14/2013 Elsevier Interactive Patient Education  2017 Elsevier Inc.      Steps to Quit Smoking Smoking tobacco can be bad for your health. It can also affect almost every organ in your body. Smoking puts you and people around you at risk for many serious long-lasting (chronic) diseases. Quitting smoking is hard, but it is one of the best things that you can do for your health. It is never too late to quit. What are the benefits of quitting smoking? When you quit smoking, you lower your risk for getting serious diseases and conditions. They can include:  Lung cancer or lung disease.  Heart disease.  Stroke.  Heart attack.  Not being able to have children (infertility).  Weak bones (osteoporosis) and broken bones (fractures). If you have coughing, wheezing, and shortness of breath, those symptoms may get better when you quit. You may also get sick less often. If you are pregnant, quitting smoking can help to lower your chances   of having a baby of low birth weight. What can I do to help me quit smoking? Talk with your doctor about what can help you quit smoking. Some things you can do (strategies) include:  Quitting smoking totally, instead of slowly cutting back how much you smoke over a period of time.  Going to in-person counseling. You are more likely to quit if you go to many counseling sessions.  Using resources and support systems, such as:  Online chats with a counselor.  Phone quitlines.  Printed self-help  materials.  Support groups or group counseling.  Text messaging programs.  Mobile phone apps or applications.  Taking medicines. Some of these medicines may have nicotine in them. If you are pregnant or breastfeeding, do not take any medicines to quit smoking unless your doctor says it is okay. Talk with your doctor about counseling or other things that can help you. Talk with your doctor about using more than one strategy at the same time, such as taking medicines while you are also going to in-person counseling. This can help make quitting easier. What things can I do to make it easier to quit? Quitting smoking might feel very hard at first, but there is a lot that you can do to make it easier. Take these steps:  Talk to your family and friends. Ask them to support and encourage you.  Call phone quitlines, reach out to support groups, or work with a counselor.  Ask people who smoke to not smoke around you.  Avoid places that make you want (trigger) to smoke, such as:  Bars.  Parties.  Smoke-break areas at work.  Spend time with people who do not smoke.  Lower the stress in your life. Stress can make you want to smoke. Try these things to help your stress:  Getting regular exercise.  Deep-breathing exercises.  Yoga.  Meditating.  Doing a body scan. To do this, close your eyes, focus on one area of your body at a time from head to toe, and notice which parts of your body are tense. Try to relax the muscles in those areas.  Download or buy apps on your mobile phone or tablet that can help you stick to your quit plan. There are many free apps, such as QuitGuide from the CDC (Centers for Disease Control and Prevention). You can find more support from smokefree.gov and other websites. This information is not intended to replace advice given to you by your health care provider. Make sure you discuss any questions you have with your health care provider. Document Released:  04/02/2009 Document Revised: 02/02/2016 Document Reviewed: 10/21/2014 Elsevier Interactive Patient Education  2017 Elsevier Inc.  

## 2016-09-14 NOTE — Addendum Note (Signed)
Addended by: Lianne Cure A on: 09/14/2016 09:58 AM   Modules accepted: Orders

## 2017-09-21 ENCOUNTER — Ambulatory Visit: Payer: Medicare Other | Admitting: Family

## 2017-09-21 ENCOUNTER — Encounter (HOSPITAL_COMMUNITY): Payer: Medicaid Other

## 2017-09-29 ENCOUNTER — Ambulatory Visit: Payer: Medicare Other | Admitting: Family

## 2017-09-29 ENCOUNTER — Encounter (HOSPITAL_COMMUNITY): Payer: Medicare Other

## 2018-01-12 ENCOUNTER — Other Ambulatory Visit: Payer: Self-pay

## 2018-01-12 ENCOUNTER — Emergency Department (HOSPITAL_COMMUNITY)
Admission: EM | Admit: 2018-01-12 | Discharge: 2018-01-12 | Disposition: A | Discharge status: Home / Self Care | Payer: Medicare Other | Attending: Emergency Medicine | Admitting: Emergency Medicine

## 2018-01-12 ENCOUNTER — Encounter (HOSPITAL_COMMUNITY): Payer: Self-pay | Admitting: Emergency Medicine

## 2018-01-12 ENCOUNTER — Emergency Department (HOSPITAL_COMMUNITY): Payer: Medicare Other

## 2018-01-12 DIAGNOSIS — R000 Tachycardia, unspecified: Secondary | ICD-10-CM | POA: Diagnosis not present

## 2018-01-12 DIAGNOSIS — R0602 Shortness of breath: Secondary | ICD-10-CM | POA: Diagnosis not present

## 2018-01-12 DIAGNOSIS — J209 Acute bronchitis, unspecified: Secondary | ICD-10-CM | POA: Insufficient documentation

## 2018-01-12 DIAGNOSIS — J40 Bronchitis, not specified as acute or chronic: Secondary | ICD-10-CM | POA: Diagnosis not present

## 2018-01-12 DIAGNOSIS — Z87891 Personal history of nicotine dependence: Secondary | ICD-10-CM | POA: Diagnosis not present

## 2018-01-12 MED ORDER — AEROCHAMBER PLUS FLO-VU LARGE MISC
Status: AC
  Filled 2018-01-12: qty 1

## 2018-01-12 MED ORDER — ALBUTEROL SULFATE (2.5 MG/3ML) 0.083% IN NEBU
5.0000 mg | INHALATION_SOLUTION | Freq: Once | RESPIRATORY_TRACT | Status: AC
  Administered 2018-01-12: 5 mg via RESPIRATORY_TRACT
  Filled 2018-01-12: qty 6

## 2018-01-12 MED ORDER — IPRATROPIUM BROMIDE 0.02 % IN SOLN
0.5000 mg | Freq: Once | RESPIRATORY_TRACT | Status: AC
  Administered 2018-01-12: 0.5 mg via RESPIRATORY_TRACT
  Filled 2018-01-12: qty 2.5

## 2018-01-12 MED ORDER — PREDNISONE 20 MG PO TABS
60.0000 mg | ORAL_TABLET | Freq: Once | ORAL | Status: AC
  Administered 2018-01-12: 60 mg via ORAL
  Filled 2018-01-12: qty 3

## 2018-01-12 MED ORDER — AEROCHAMBER PLUS FLO-VU LARGE MISC
1.0000 | Freq: Once | Status: AC
  Administered 2018-01-12: 1

## 2018-01-12 MED ORDER — ALBUTEROL SULFATE HFA 108 (90 BASE) MCG/ACT IN AERS
2.0000 | INHALATION_SPRAY | RESPIRATORY_TRACT | Status: DC | PRN
  Administered 2018-01-12: 2 via RESPIRATORY_TRACT
  Filled 2018-01-12: qty 6.7

## 2018-01-12 NOTE — Discharge Instructions (Addendum)
Please use your inhaler if any ongoing wheezing Return if you are worse at any time. Recheck with a primary care doctor next week.

## 2018-01-12 NOTE — ED Provider Notes (Signed)
[facility] EMERGENCY DEPARTMENT Provider Note   CSN: [id number] Arrival date & time: 01/12/18  1705     History   Chief Complaint Chief Complaint  Patient presents with  . Shortness of Breath    HPI Rick Davenport is a 57 y.o. male.  HPI  57 year old man history of colon cancer, smoking presents today complaining of intermittent cough with some wheezing over the past 2 weeks.  Cough has been nonproductive.  He states that the wheezing comes with the coughing but then resolves.  He has one episode when he felt like he might of had a fever but other than that has not had fever or chills.  He has been eating and drinking without difficulty.  He endorsed shortness of breath on his initial evaluation by Genella Rife PA-C but to me he states he has not been short of breath and is not currently short of breath.  He denies any chest pain.  He denies leg pain, leg swelling, or history of DVT or PE.  Past Medical History:  Diagnosis Date  . Cancer ([place]) 09/2013   colon ca  . Peripheral vascular disease [facility])     Patient Active Problem List   Diagnosis Date Noted  . 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 Smoking 10/15/2013  . Adenocarcinoma of cecum ([place]) 10/08/2013  . 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 Tobacco use disorder 10/08/2013  . Loss of weight 10/08/2013  . 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 Colon cancer ([place]) 10/03/2013  . Anemia 10/03/2013    Past Surgical History:  Procedure Laterality Date  . PARTIAL COLECTOMY Right 10/04/2013   Procedure: RIGHT HEMICOLECTOMY ;  Surgeon: Imogene Burn. Georgette Dover, MD;  Location: [facility] OR;  Service: General;  Laterality: Right;        Home Medications    Prior to Admission medications   Not on File    Family History Family History  Problem Relation Age of Onset  . Diabetes Mother   . Lung cancer Father   . Prostate cancer Brother        Oldest brother    Social History Social  History   Tobacco Use  . Smoking status: Former Smoker    Packs/day: 1.00    Years: 40.00    Pack years: 40.00    Types: Cigarettes    Last attempt to quit: 07/13/2015    Years since quitting: 2.5  . Smokeless tobacco: Never Used  . Tobacco comment: pt states that he is trying to quit smoking  Substance Use Topics  . Alcohol use: Yes    Alcohol/week: 0.0 oz    Comment: 1 can of beer per day  . Drug use: No     Allergies   Patient has no known allergies.   Review of Systems Review of Systems  All other systems reviewed and are negative.    Physical Exam Updated Vital Signs BP (!) 157/111   Pulse 82   Temp 98.5 F (36.9 C) (Oral)   Resp 18   Ht 1.829 m (6')   Wt 72.6 kg (160 lb)   SpO2 98%   BMI 21.70 kg/m   Physical Exam  Constitutional: He appears well-developed and well-nourished.  HENT:  Head: Normocephalic and atraumatic.  Mouth/Throat: Oropharynx is clear and moist.  Eyes: Pupils are equal, round, and reactive to light. EOM are normal.  Neck: Normal range of motion. Neck supple.  Cardiovascular: Normal rate and normal heart sounds.  Pulmonary/Chest: Effort normal and breath sounds normal.  Her Port-A-Cath noted on right chest wall  Abdominal: Soft. Bowel sounds are normal.  Musculoskeletal: Normal range of motion.       Right lower leg: Normal. He exhibits no edema.       Left lower leg: Normal. He exhibits no edema.  Neurological: He is alert.  Skin: Skin is warm and dry. Capillary refill takes less than 2 seconds.  Nursing note and vitals reviewed.    ED Treatments / Results  Labs (all labs ordered are listed, but only abnormal results are displayed) Labs Reviewed - No data to display  EKG EKG Interpretation  Date/Time:  Friday January 12 2018 17:06:31 EDT Ventricular Rate:  119 PR Interval:  112 QRS Duration: 82 QT Interval:  326 QTC Calculation: 458 R Axis:   51 Text Interpretation:  Sinus tachycardia Abnormal ECG Confirmed by Pattricia Boss [phone]) on 01/12/2018 9:40:40 PM   Radiology Dg Chest 2 View  Result Date: 01/12/2018 CLINICAL DATA:  Shortness of breath for 2 weeks. EXAM: CHEST - 2 VIEW COMPARISON:  None. FINDINGS: There is right Port-A-Cath. No pneumothorax. The heart, hila, and mediastinum are normal. No pulmonary nodules or masses. No focal infiltrates. No cause for shortness of breath identified. IMPRESSION: No active cardiopulmonary disease. Electronically Signed   By: Dorise Bullion III M.D   On: 01/12/2018 18:45    Procedures Procedures (including critical care time)  Medications Ordered in ED Medications  predniSONE (DELTASONE) tablet 60 mg (60 mg Oral Given 01/12/18 1748)  albuterol (PROVENTIL) (2.5 MG/3ML) 0.083% nebulizer solution 5 mg (5 mg Nebulization Given 01/12/18 1748)  ipratropium (ATROVENT) nebulizer solution 0.5 mg (0.5 mg Nebulization Given 01/12/18 1748)     Initial Impression / Assessment and Plan / ED Course  I have reviewed the triage vital signs and the nursing notes.  Pertinent labs & imaging results that were available during my care of the patient were reviewed by me and considered in my medical decision making (see chart for details).     Patient with cough and wheezing intermittently over the past week.  Here he was given a breathing treatment and his symptoms resolved.  He was tachycardic after the breathing treatment but not prior to this.  On my exam his heart rate has decreased back to normal.  He does have a history of adenocarcinoma of the cecum and has some increased risk factor for PE, but clinical course is not consistent with PE on my evaluation.  He is a smoker and has been advised regarding smoking cessation.  Suspect some upper respiratory infection and bronchitis but does not have any apparent acute infiltrate on chest x-Ranetta Armacost.  Plan albuterol HFA.  I discussed return precautions and need for close follow-up and patient voices understanding  Final Clinical  Impressions(s) / ED Diagnoses   Final diagnoses:  Bronchitis    ED Discharge Orders    None       Pattricia Boss, MD 01/12/18 2147

## 2018-01-12 NOTE — ED Triage Notes (Signed)
Pt reports SOB and cough x 2 weeks states nothing makes it better or worse. Wheezing in triage. Childhood asthma but reports he grew out of.

## 2018-01-12 NOTE — ED Provider Notes (Signed)
Patient placed in Quick Look pathway, seen and evaluated   Chief Complaint: Wheezing, shortness of breath  HPI: Patient with history of smoking, no history of COPD heart failure or asthma presents with complaint of cough and wheezing over the past 2 weeks.  Patient has been taking TheraFlu without relief.  He denies fevers, chest pain, leg swelling.  No abdominal pain, nausea, vomiting, or diarrhea.  ROS:  Positive ROS: (+) Shortness of breath, cough, wheezing Negative ROS: (-) Fever leg swelling  Physical Exam:   Gen: No distress  Neuro: Awake and Alert  Skin: Warm    Focused Exam: Heart mild tachycardia, nml S1,S2, no m/r/g; Lungs chattered inspiratory next Tory wheezing bilaterally; Abd soft, NT, no rebound or guarding; Ext 2+ pedal pulses bilaterally, no edema.  BP (!) 128/106 (BP Location: Right Arm)   Pulse (!) 118   Temp 98.5 F (36.9 C) (Oral)   Resp 18   SpO2 97%   Plan: Chest x-ray, breathing treatment, prednisone, EKG.  Initiation of care has begun. The patient has been counseled on the process, plan, and necessity for staying for the completion/evaluation, and the remainder of the medical screening examination    Carlisle Cater, Hershal Coria 01/12/18 [place]    Lajean Saver, MD 01/12/18 1759

## 2018-01-22 ENCOUNTER — Emergency Department (HOSPITAL_COMMUNITY): Payer: Medicare Other

## 2018-01-22 ENCOUNTER — Emergency Department (HOSPITAL_COMMUNITY)
Admission: EM | Admit: 2018-01-22 | Discharge: 2018-01-23 | Disposition: A | Discharge status: Home / Self Care | Payer: Medicare Other | Attending: Emergency Medicine | Admitting: Emergency Medicine

## 2018-01-22 ENCOUNTER — Other Ambulatory Visit: Payer: Self-pay

## 2018-01-22 ENCOUNTER — Encounter (HOSPITAL_COMMUNITY): Payer: Self-pay | Admitting: Emergency Medicine

## 2018-01-22 DIAGNOSIS — Z85038 Personal history of other malignant neoplasm of large intestine: Secondary | ICD-10-CM | POA: Diagnosis not present

## 2018-01-22 DIAGNOSIS — J441 Chronic obstructive pulmonary disease with (acute) exacerbation: Secondary | ICD-10-CM | POA: Insufficient documentation

## 2018-01-22 DIAGNOSIS — R05 Cough: Secondary | ICD-10-CM | POA: Diagnosis not present

## 2018-01-22 DIAGNOSIS — I10 Essential (primary) hypertension: Secondary | ICD-10-CM | POA: Diagnosis not present

## 2018-01-22 DIAGNOSIS — F17210 Nicotine dependence, cigarettes, uncomplicated: Secondary | ICD-10-CM | POA: Diagnosis not present

## 2018-01-22 DIAGNOSIS — R000 Tachycardia, unspecified: Secondary | ICD-10-CM | POA: Diagnosis not present

## 2018-01-22 DIAGNOSIS — R0689 Other abnormalities of breathing: Secondary | ICD-10-CM | POA: Diagnosis not present

## 2018-01-22 DIAGNOSIS — R0602 Shortness of breath: Secondary | ICD-10-CM | POA: Diagnosis not present

## 2018-01-22 HISTORY — DX: Unspecified asthma, uncomplicated: J45.909

## 2018-01-22 MED ORDER — PREDNISONE 20 MG PO TABS: 40.0000 mg | ORAL_TABLET | Freq: Every day | ORAL | 0 refills | Status: DC

## 2018-01-22 MED ORDER — ALBUTEROL SULFATE HFA 108 (90 BASE) MCG/ACT IN AERS
2.0000 | INHALATION_SPRAY | RESPIRATORY_TRACT | Status: DC | PRN
  Filled 2018-01-22: qty 6.7

## 2018-01-22 NOTE — ED Triage Notes (Signed)
Patient arrived with EMS from home reports worsening SOB with wheezing and chest congestion onset this evening , he received Solumedrol 125 mg IV , Magnesium 2 mg IV and 2 doses of Duoneb treatment by EMS with mild improvement . Denies fever or chills , occasional productive cough .

## 2018-01-23 MED ORDER — PREDNISONE 20 MG PO TABS: 40.0000 mg | ORAL_TABLET | Freq: Every day | ORAL | 0 refills | Status: DC

## 2018-01-23 NOTE — ED Provider Notes (Signed)
[facility] EMERGENCY DEPARTMENT Provider Note   CSN: [id number] Arrival date & time: 01/22/18  2030     History   Chief Complaint Chief Complaint  Patient presents with  . Shortness of Breath    Wheezing    HPI Rick Davenport is a 57 y.o. male.  57yo M w/ PMH including colon cancer, PVD, asthma, tobacco use who p/w shortness of breath and wheezing. Pt was seen on 7/26 for SOB and wheezing, given inhaler and discharged.  He reports he was doing okay using the inhaler occasionally but today ran out of his inhaler and tonight began feeling short of breath again.  He has had a cough but denies any fevers, chills, sore throat, chest pain, or other complaints.  He was given medications by EMS and notes that they did improve his symptoms.  The history is provided by the patient.  Shortness of Breath     Past Medical History:  Diagnosis Date  . Asthma   . Cancer ([place]) 09/2013   colon ca  . Peripheral vascular disease [facility])     Patient Active Problem List   Diagnosis Date Noted  . 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 Smoking 10/15/2013  . Adenocarcinoma of cecum ([place]) 10/08/2013  . 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 Tobacco use disorder 10/08/2013  . Loss of weight 10/08/2013  . 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 Colon cancer ([place]) 10/03/2013  . Anemia 10/03/2013    Past Surgical History:  Procedure Laterality Date  . PARTIAL COLECTOMY Right 10/04/2013   Procedure: RIGHT HEMICOLECTOMY ;  Surgeon: Imogene Burn. Georgette Dover, MD;  Location: [facility];  Service: General;  Laterality: Right;        Home Medications    Prior to Admission medications   Medication Sig Start Date End Date Taking? Authorizing Provider  predniSONE (DELTASONE) 20 MG tablet Take 2 tablets (40 mg total) by mouth daily. 01/22/18   Joeangel Jeanpaul, Wenda Overland, MD  predniSONE (DELTASONE) 20 MG tablet Take 2 tablets (40 mg total) by mouth daily. 01/23/18   Paislei Dorval, Wenda Overland, MD    Family History Family History  Problem Relation Age of Onset  . Diabetes Mother   . Lung cancer Father   . Prostate cancer Brother        Oldest brother    Social History Social History   Tobacco Use  . Smoking status: Former Smoker    Packs/day: 1.00    Years: 40.00    Pack years: 40.00    Types: Cigarettes    Last attempt to quit: 07/13/2015    Years since quitting: 2.5  . Smokeless tobacco: Never Used  . Tobacco comment: pt states that he is trying to quit smoking  Substance Use Topics  . Alcohol use: Yes    Alcohol/week: 0.0 oz    Comment: 1 can of beer per day  . Drug use: No     Allergies   Patient has no known allergies.   Review of Systems Review of Systems  Respiratory: Positive for shortness of breath.    All other systems reviewed and are negative except that which was mentioned in HPI   Physical Exam Updated Vital Signs BP (!) 146/99   Pulse 98   Temp 97.6 F (36.4 C) (Oral)   Resp 14   Ht 6' (1.829 m)   Wt 72.6 kg (160 lb)   SpO2 97%   BMI 21.70 kg/m   Physical  Exam  Constitutional: He is oriented to person, place, and time. He appears well-developed and well-nourished. No distress.  HENT:  Head: Normocephalic and atraumatic.  Moist mucous membranes  Eyes: Conjunctivae are normal.  Neck: Neck supple.  Cardiovascular: Normal rate, regular rhythm and normal heart sounds.  No murmur heard. Pulmonary/Chest: He has decreased breath sounds. He has wheezes.  Mild tachypnea w/ mildly increased WOB, no distress; diminished bilaterally with faint end expiratory wheezes  Abdominal: Soft. Bowel sounds are normal. He exhibits no distension. There is no tenderness.  Musculoskeletal: He exhibits no edema.  Neurological: He is alert and oriented to person, place, and time.  Fluent speech  Skin: Skin is warm and dry.  Psychiatric: He has a normal mood and affect. Judgment normal.  Nursing note and vitals reviewed.    ED Treatments  / Results  Labs (all labs ordered are listed, but only abnormal results are displayed) Labs Reviewed - No data to display  EKG EKG Interpretation  Date/Time:  Monday January 22 2018 20:40:32 EDT Ventricular Rate:  91 PR Interval:    QRS Duration: 87 QT Interval:  388 QTC Calculation: 478 R Axis:   35 Text Interpretation:  Sinus rhythm Abnormal R-wave progression, early transition Borderline prolonged QT interval similar to previous Confirmed by Theotis Burrow [phone]) on 01/22/2018 10:51:25 PM   Radiology Dg Chest 2 View  Result Date: 01/22/2018 CLINICAL DATA:  57 year old male with history of shortness of breath, cough and nausea and vomiting for 1 day. History of asthma. EXAM: CHEST - 2 VIEW COMPARISON:  Chest x-ray 01/12/2018. FINDINGS: Right internal jugular single-lumen power porta cath with tip terminating at the superior cavoatrial junction. Lung volumes are normal. No consolidative airspace disease. No pleural effusions. No pneumothorax. No pulmonary nodule or mass noted. Pulmonary vasculature and the cardiomediastinal silhouette are within normal limits. IMPRESSION: No radiographic evidence of acute cardiopulmonary disease. Electronically Signed   By: Vinnie Langton M.D.   On: 01/22/2018 21:48    Procedures Procedures (including critical care time)  Medications Ordered in ED Medications  albuterol (PROVENTIL HFA;VENTOLIN HFA) 108 (90 Base) MCG/ACT inhaler 2 puff (has no administration in time range)     Initial Impression / Assessment and Plan / ED Course  I have reviewed the triage vital signs and the nursing notes.  Pertinent imaging results that were available during my care of the patient were reviewed by me and considered in my medical decision making (see chart for details).     VS stable on arrival.  He had already received Solu-Medrol, magnesium, and 2 DuoNeb's by EMS.  Gave repeat DuoNeb here.  Chest x-ray negative for infiltrate.  Observe the patient for  several hours and on repeat exam he had normal work of breathing, improved air movement, and stated he felt better.  He states he did not go home with any steroids last time.  He does have wheezing today and given his history of tobacco use, will treat for COPD exacerbation with a course of prednisone.  Counseled the patient on smoking cessation.  Return precautions reviewed.  Final Clinical Impressions(s) / ED Diagnoses   Final diagnoses:  COPD exacerbation [facility])    ED Discharge Orders        Ordered    predniSONE (DELTASONE) 20 MG tablet  Daily     01/23/18 0028    predniSONE (DELTASONE) 20 MG tablet  Daily     01/22/18 2351       Audry Kauzlarich, Wenda Overland, MD 01/23/18  0035  

## 2019-04-19 DIAGNOSIS — S92354A Nondisplaced fracture of fifth metatarsal bone, right foot, initial encounter for closed fracture: Secondary | ICD-10-CM | POA: Diagnosis not present

## 2019-04-19 DIAGNOSIS — B351 Tinea unguium: Secondary | ICD-10-CM | POA: Diagnosis not present

## 2019-04-19 DIAGNOSIS — S92355A Nondisplaced fracture of fifth metatarsal bone, left foot, initial encounter for closed fracture: Secondary | ICD-10-CM | POA: Diagnosis not present

## 2019-04-19 DIAGNOSIS — L97512 Non-pressure chronic ulcer of other part of right foot with fat layer exposed: Secondary | ICD-10-CM | POA: Diagnosis not present

## 2019-04-19 DIAGNOSIS — M79675 Pain in left toe(s): Secondary | ICD-10-CM | POA: Diagnosis not present

## 2019-04-19 DIAGNOSIS — Q666 Other congenital valgus deformities of feet: Secondary | ICD-10-CM | POA: Diagnosis not present

## 2019-04-19 DIAGNOSIS — M79674 Pain in right toe(s): Secondary | ICD-10-CM | POA: Diagnosis not present

## 2019-04-19 DIAGNOSIS — L97511 Non-pressure chronic ulcer of other part of right foot limited to breakdown of skin: Secondary | ICD-10-CM | POA: Diagnosis not present

## 2019-05-03 DIAGNOSIS — L97511 Non-pressure chronic ulcer of other part of right foot limited to breakdown of skin: Secondary | ICD-10-CM | POA: Diagnosis not present

## 2019-05-03 DIAGNOSIS — L97512 Non-pressure chronic ulcer of other part of right foot with fat layer exposed: Secondary | ICD-10-CM | POA: Diagnosis not present

## 2019-05-03 DIAGNOSIS — M722 Plantar fascial fibromatosis: Secondary | ICD-10-CM | POA: Diagnosis not present

## 2019-05-03 DIAGNOSIS — Q666 Other congenital valgus deformities of feet: Secondary | ICD-10-CM | POA: Diagnosis not present

## 2019-12-24 ENCOUNTER — Telehealth: Payer: Self-pay | Admitting: *Deleted

## 2019-12-24 NOTE — Telephone Encounter (Signed)
Patient presented this morning at front desk asking for documentation for his landlord that he is a patient at the [facility]. Called him and left VM that we can provide note that he was seen in the past, but since his last visit here was 02/19/2015, we are not able to state he is a current patient.

## 2019-12-30 ENCOUNTER — Telehealth: Payer: Self-pay | Admitting: Oncology

## 2019-12-30 NOTE — Telephone Encounter (Signed)
Release: [phone] Patient Requested medical records pertaining to his Colon Cancer. Printed and provided records for patient p/u.

## 2020-01-29 ENCOUNTER — Encounter: Payer: Self-pay | Admitting: Gastroenterology

## 2020-03-23 ENCOUNTER — Other Ambulatory Visit: Payer: Self-pay

## 2020-03-23 ENCOUNTER — Ambulatory Visit (AMBULATORY_SURGERY_CENTER): Payer: Medicare Other | Admitting: *Deleted

## 2020-03-23 VITALS — Ht 73.0 in | Wt 177.0 lb

## 2020-03-23 DIAGNOSIS — Z85038 Personal history of other malignant neoplasm of large intestine: Secondary | ICD-10-CM

## 2020-03-23 MED ORDER — NA SULFATE-K SULFATE-MG SULF 17.5-3.13-1.6 GM/177ML PO SOLN: ORAL | 0 refills | Status: DC

## 2020-03-23 NOTE — Progress Notes (Signed)
Patient is here in-person for PV. Patient denies any allergies to eggs or soy. Patient denies any problems with anesthesia/sedation. Patient denies any oxygen use at home. Patient denies taking any diet/weight loss medications or blood thinners. Patient is not being treated for MRSA or C-diff. Patient is aware of our care-partner policy and [id number] safety protocol. COVID-19 vaccines completed on 01/2020, per patient.

## 2020-04-06 ENCOUNTER — Encounter: Payer: Medicare Other | Admitting: Gastroenterology

## 2020-04-06 ENCOUNTER — Encounter: Payer: Self-pay | Admitting: Gastroenterology

## 2020-04-06 ENCOUNTER — Other Ambulatory Visit: Payer: Self-pay

## 2020-04-06 ENCOUNTER — Ambulatory Visit (AMBULATORY_SURGERY_CENTER): Payer: Medicare (Managed Care) | Admitting: Gastroenterology

## 2020-04-06 VITALS — BP 129/91 | HR 57 | Temp 97.3°F | Resp 10 | Ht 73.0 in | Wt 177.0 lb

## 2020-04-06 DIAGNOSIS — Z1211 Encounter for screening for malignant neoplasm of colon: Secondary | ICD-10-CM

## 2020-04-06 DIAGNOSIS — D123 Benign neoplasm of transverse colon: Secondary | ICD-10-CM | POA: Diagnosis not present

## 2020-04-06 DIAGNOSIS — D124 Benign neoplasm of descending colon: Secondary | ICD-10-CM

## 2020-04-06 DIAGNOSIS — D125 Benign neoplasm of sigmoid colon: Secondary | ICD-10-CM | POA: Diagnosis not present

## 2020-04-06 DIAGNOSIS — Z85038 Personal history of other malignant neoplasm of large intestine: Secondary | ICD-10-CM | POA: Diagnosis not present

## 2020-04-06 DIAGNOSIS — D128 Benign neoplasm of rectum: Secondary | ICD-10-CM

## 2020-04-06 MED ORDER — SODIUM CHLORIDE 0.9 % IV SOLN: 500.0000 mL | Freq: Once | INTRAVENOUS | Status: DC

## 2020-04-06 NOTE — Patient Instructions (Signed)
Impression/Recommendations:  Polyp, Diverticulosis, high fiber diet, and hemorrhoid handouts given to patient.  Repeat colonoscopy likely in 2-3 years for surveillance.  Date to be determined after pathology results reviewed.  Continue present medications. Await pathology results.  YOU HAD AN ENDOSCOPIC PROCEDURE TODAY AT [facility]:   Refer to the procedure report that was given to you for any specific questions about what was found during the examination.  If the procedure report does not answer your questions, please call your gastroenterologist to clarify.  If you requested that your care partner not be given the details of your procedure findings, then the procedure report has been included in a sealed envelope for you to review at your convenience later.  YOU SHOULD EXPECT: Some feelings of bloating in the abdomen. Passage of more gas than usual.  Walking can help get rid of the air that was put into your GI tract during the procedure and reduce the bloating. If you had a lower endoscopy (such as a colonoscopy or flexible sigmoidoscopy) you may notice spotting of blood in your stool or on the toilet paper. If you underwent a bowel prep for your procedure, you may not have a normal bowel movement for a few days.  Please Note:  You might notice some irritation and congestion in your nose or some drainage.  This is from the oxygen used during your procedure.  There is no need for concern and it should clear up in a day or so.  SYMPTOMS TO REPORT IMMEDIATELY:   Following lower endoscopy (colonoscopy or flexible sigmoidoscopy):  Excessive amounts of blood in the stool  Significant tenderness or worsening of abdominal pains  Swelling of the abdomen that is new, acute  Fever of 100F or higher  For urgent or emergent issues, a gastroenterologist can be reached at any hour by calling [phone]. Do not use MyChart messaging for urgent concerns.    DIET:  We do  recommend a small meal at first, but then you may proceed to your regular diet.  Drink plenty of fluids but you should avoid alcoholic beverages for 24 hours.  ACTIVITY:  You should plan to take it easy for the rest of today and you should NOT DRIVE or use heavy machinery until tomorrow (because of the sedation medicines used during the test).    FOLLOW UP: Our staff will call the number listed on your records 48-72 hours following your procedure to check on you and address any questions or concerns that you may have regarding the information given to you following your procedure. If we do not reach you, we will leave a message.  We will attempt to reach you two times.  During this call, we will ask if you have developed any symptoms of COVID 19. If you develop any symptoms (ie: fever, flu-like symptoms, shortness of breath, cough etc.) before then, please call [phone].  If you test positive for Covid 19 in the 2 weeks post procedure, please call and report this information to [place].    If any biopsies were taken you will be contacted by phone or by letter within the next 1-3 weeks.  Please call us at [phone] if you have not heard about the biopsies in 3 weeks.    SIGNATURES/CONFIDENTIALITY: You and/or your care partner have signed paperwork which will be entered into your electronic medical record.  These signatures attest to the fact that that the information above on your After Visit Summary has been  reviewed and is understood.  Full responsibility of the confidentiality of this discharge information lies with you and/or your care-partner. 

## 2020-04-06 NOTE — Progress Notes (Signed)
Pt's states no medical or surgical changes since previsit or office visit.  CW - vitals 

## 2020-04-06 NOTE — Progress Notes (Signed)
Called pt.'s care partner - son Louie Casa.  He said he didn't think the procedure would be completed yet, and had gone to a Dr. Venida Jarvis.  Says he will be here in 10 minutes.

## 2020-04-06 NOTE — Progress Notes (Signed)
Attempted to call Pt.'s son Louie Casa to see if he has arrived to pick up his father.  No answer., LM on VM.

## 2020-04-06 NOTE — Progress Notes (Signed)
Pt. Voided 3000 cc in urinal.

## 2020-04-06 NOTE — Op Note (Signed)
[name] Procedure Date: 04/06/2020 10:40 AM MRN: [id number] Endoscopist: Ladene Artist , MD Age: 59 Referring MD:  Date of Birth: 1960/10/13 Gender: Male Account #: [id number] Procedure:                Colonoscopy Indications:              High risk colon cancer surveillance: Personal                            history of colon cancer Medicines:                Monitored Anesthesia Care Procedure:                Pre-Anesthesia Assessment:                           - Prior to the procedure, a History and Physical                            was performed, and patient medications and                            allergies were reviewed. The patient's tolerance of                            previous anesthesia was also reviewed. The risks                            and benefits of the procedure and the sedation                            options and risks were discussed with the patient.                            All questions were answered, and informed consent                            was obtained. Prior Anticoagulants: The patient has                            taken no previous anticoagulant or antiplatelet                            agents. ASA Grade Assessment: III - A patient with                            severe systemic disease. After reviewing the risks                            and benefits, the patient was deemed in                            satisfactory condition to undergo the procedure.  After obtaining informed consent, the colonoscope                            was passed under direct vision. Throughout the                            procedure, the patient's blood pressure, pulse, and                            oxygen saturations were monitored continuously. The                            Colonoscope was introduced through the anus and                            advanced to the the ileocolonic  anastomosis. The                            rectum was photographed. The quality of the bowel                            preparation was good. The colonoscopy was performed                            without difficulty. The patient tolerated the                            procedure well. Scope In: 10:50:25 AM Scope Out: 11:05:06 AM Scope Withdrawal Time: 0 hours 12 minutes 21 seconds  Total Procedure Duration: 0 hours 14 minutes 41 seconds  Findings:                 The perianal and digital rectal examinations were                            normal.                           There was evidence of a prior end-to-side                            ileo-colonic anastomosis in the ascending colon.                            This was patent and was characterized by healthy                            appearing mucosa. The anastomosis was not traversed.                           A 5 mm polyp was found in the sigmoid colon. The                            polyp was sessile. The polyp was removed with a  cold biopsy forceps. Resection and retrieval were                            complete.                           Four sessile polyps were found in the rectum (1),                            descending colon (2) and transverse colon (1). The                            polyps were 6 to 10 mm in size. These polyps were                            removed with a cold snare. Resection and retrieval                            were complete.                           Multiple medium-mouthed diverticula were found in                            the left colon. There was no evidence of                            diverticular bleeding.                           Internal hemorrhoids were found during                            retroflexion. The hemorrhoids were moderate and                            Grade I (internal hemorrhoids that do not prolapse).                           The exam  was otherwise without abnormality on                            direct and retroflexion views. Complications:            No immediate complications. Estimated blood loss:                            None. Estimated Blood Loss:     Estimated blood loss: none. Impression:               - Patent end-to-side ileo-colonic anastomosis,                            characterized by healthy appearing mucosa.                           -  One 5 mm polyp in the sigmoid colon, removed with                            a cold biopsy forceps. Resected and retrieved.                           - Four 6 to 10 mm polyps in the rectum, in the                            descending colon and in the transverse colon,                            removed with a cold snare. Resected and retrieved.                           - Moderate diverticulosis in the left colon.                           - Internal hemorrhoids.                           - The examination was otherwise normal on direct                            and retroflexion views. Recommendation:           - Repeat colonoscopy, likely 2-3 years, after                            studies are complete for surveillance based on                            pathology results.                           - Patient has a contact number available for                            emergencies. The signs and symptoms of potential                            delayed complications were discussed with the                            patient. Return to normal activities tomorrow.                            Written discharge instructions were provided to the                            patient.                           - High fiber diet.                           -  Continue present medications.                           - Await pathology results. Ladene Artist, MD 04/06/2020 11:12:04 AM This report has been signed electronically.

## 2020-04-06 NOTE — Progress Notes (Signed)
Called to room to assist during endoscopic procedure.  Patient ID and intended procedure confirmed with present staff. Received instructions for my participation in the procedure from the performing physician.  

## 2020-04-06 NOTE — Progress Notes (Signed)
To PACU, VSS. Report to [email or web address] 

## 2020-04-08 ENCOUNTER — Telehealth: Payer: Self-pay

## 2020-04-08 NOTE — Telephone Encounter (Signed)
Left message on follow up call. 

## 2020-04-08 NOTE — Telephone Encounter (Signed)
°  Follow up Call-  Call back number 04/06/2020  Post procedure Call Back phone  # [phone]  Permission to leave phone message Yes  Some recent data might be hidden     Patient questions:  Do you have a fever, pain , or abdominal swelling? No. Pain Score  0 *  Have you tolerated food without any problems? Yes.    Have you been able to return to your normal activities? Yes.    Do you have any questions about your discharge instructions: Diet   No. Medications  No. Follow up visit  No.  Do you have questions or concerns about your Care? No.  Actions: * If pain score is 4 or above: 1. No action needed, pain <4.Have you developed a fever since your procedure? no  2.   Have you had an respiratory symptoms (SOB or cough) since your procedure? no  3.   Have you tested positive for COVID 19 since your procedure no  4.   Have you had any family members/close contacts diagnosed with the COVID 19 since your procedure?  no   If yes to any of these questions please route to Joylene John, RN and Joella Prince, RN

## 2020-04-15 ENCOUNTER — Encounter: Payer: Self-pay | Admitting: Gastroenterology

## 2022-07-05 ENCOUNTER — Encounter: Payer: Self-pay | Admitting: Gastroenterology
